# Patient Record
Sex: Female | Born: 1970 | Race: Black or African American | Hispanic: No | Marital: Married | Smoking: Never smoker
Health system: Southern US, Community
[De-identification: ages and names within clinical notes are randomized; demographics above are authoritative.]

## PROBLEM LIST (undated history)

## (undated) DIAGNOSIS — T7840XA Allergy, unspecified, initial encounter: Secondary | ICD-10-CM

## (undated) DIAGNOSIS — J189 Pneumonia, unspecified organism: Secondary | ICD-10-CM

## (undated) HISTORY — DX: Pneumonia, unspecified organism: J18.9

## (undated) HISTORY — DX: Allergy, unspecified, initial encounter: T78.40XA

---

## 2004-02-01 ENCOUNTER — Inpatient Hospital Stay (HOSPITAL_COMMUNITY): Admission: AD | Admit: 2004-02-01 | Discharge: 2004-02-02 | Payer: Self-pay | Admitting: Obstetrics and Gynecology

## 2004-04-09 ENCOUNTER — Ambulatory Visit: Payer: Self-pay | Admitting: Family Medicine

## 2004-04-12 ENCOUNTER — Encounter (INDEPENDENT_AMBULATORY_CARE_PROVIDER_SITE_OTHER): Payer: Self-pay | Admitting: *Deleted

## 2004-04-19 ENCOUNTER — Ambulatory Visit: Payer: Self-pay | Admitting: Sports Medicine

## 2004-05-25 ENCOUNTER — Ambulatory Visit: Payer: Self-pay | Admitting: Family Medicine

## 2004-06-01 ENCOUNTER — Encounter: Admission: RE | Admit: 2004-06-01 | Discharge: 2004-06-01 | Payer: Self-pay | Admitting: Sports Medicine

## 2004-07-03 ENCOUNTER — Ambulatory Visit: Payer: Self-pay | Admitting: Family Medicine

## 2004-07-19 ENCOUNTER — Ambulatory Visit: Payer: Self-pay | Admitting: Family Medicine

## 2004-07-30 ENCOUNTER — Encounter: Admission: RE | Admit: 2004-07-30 | Discharge: 2004-07-30 | Payer: Self-pay | Admitting: Sports Medicine

## 2004-08-20 ENCOUNTER — Ambulatory Visit: Payer: Self-pay | Admitting: Family Medicine

## 2004-08-28 ENCOUNTER — Ambulatory Visit: Payer: Self-pay | Admitting: Family Medicine

## 2004-08-29 ENCOUNTER — Ambulatory Visit (HOSPITAL_COMMUNITY): Admission: RE | Admit: 2004-08-29 | Discharge: 2004-08-29 | Payer: Self-pay | Admitting: Family Medicine

## 2004-09-06 ENCOUNTER — Ambulatory Visit: Payer: Self-pay | Admitting: Family Medicine

## 2004-09-14 ENCOUNTER — Ambulatory Visit: Payer: Self-pay | Admitting: Family Medicine

## 2004-09-19 ENCOUNTER — Ambulatory Visit: Payer: Self-pay | Admitting: Family Medicine

## 2004-09-27 ENCOUNTER — Ambulatory Visit: Payer: Self-pay | Admitting: Family Medicine

## 2004-09-28 ENCOUNTER — Inpatient Hospital Stay (HOSPITAL_COMMUNITY): Admission: AD | Admit: 2004-09-28 | Discharge: 2004-10-02 | Payer: Self-pay | Admitting: Obstetrics & Gynecology

## 2004-09-28 ENCOUNTER — Ambulatory Visit: Payer: Self-pay | Admitting: *Deleted

## 2004-09-28 ENCOUNTER — Ambulatory Visit: Payer: Self-pay | Admitting: Obstetrics & Gynecology

## 2004-09-29 ENCOUNTER — Encounter (INDEPENDENT_AMBULATORY_CARE_PROVIDER_SITE_OTHER): Payer: Self-pay | Admitting: *Deleted

## 2004-12-04 ENCOUNTER — Ambulatory Visit: Payer: Self-pay | Admitting: Family Medicine

## 2005-01-04 ENCOUNTER — Ambulatory Visit: Payer: Self-pay | Admitting: Sports Medicine

## 2005-01-31 ENCOUNTER — Ambulatory Visit: Payer: Self-pay | Admitting: Sports Medicine

## 2005-07-11 ENCOUNTER — Encounter: Admission: RE | Admit: 2005-07-11 | Discharge: 2005-07-11 | Payer: Self-pay | Admitting: Sports Medicine

## 2006-03-06 DIAGNOSIS — N6029 Fibroadenosis of unspecified breast: Secondary | ICD-10-CM

## 2006-03-07 ENCOUNTER — Encounter (INDEPENDENT_AMBULATORY_CARE_PROVIDER_SITE_OTHER): Payer: Self-pay | Admitting: *Deleted

## 2006-07-09 ENCOUNTER — Ambulatory Visit: Payer: Self-pay | Admitting: Sports Medicine

## 2006-07-29 ENCOUNTER — Encounter: Payer: Self-pay | Admitting: Family Medicine

## 2006-07-29 ENCOUNTER — Ambulatory Visit: Payer: Self-pay | Admitting: Family Medicine

## 2006-07-29 DIAGNOSIS — N926 Irregular menstruation, unspecified: Secondary | ICD-10-CM

## 2006-07-29 DIAGNOSIS — M549 Dorsalgia, unspecified: Secondary | ICD-10-CM | POA: Insufficient documentation

## 2006-07-29 DIAGNOSIS — R109 Unspecified abdominal pain: Secondary | ICD-10-CM

## 2006-07-29 LAB — CONVERTED CEMR LAB
Chlamydia, DNA Probe: NEGATIVE
GC Probe Amp, Genital: NEGATIVE
KOH Prep: NEGATIVE
Whiff Test: NEGATIVE

## 2006-08-04 ENCOUNTER — Ambulatory Visit (HOSPITAL_COMMUNITY): Admission: RE | Admit: 2006-08-04 | Discharge: 2006-08-04 | Payer: Self-pay | Admitting: Sports Medicine

## 2006-08-19 ENCOUNTER — Ambulatory Visit: Payer: Self-pay | Admitting: Family Medicine

## 2006-08-19 ENCOUNTER — Encounter: Payer: Self-pay | Admitting: Family Medicine

## 2007-09-04 ENCOUNTER — Emergency Department (HOSPITAL_COMMUNITY): Admission: EM | Admit: 2007-09-04 | Discharge: 2007-09-05 | Payer: Self-pay | Admitting: Emergency Medicine

## 2009-12-11 ENCOUNTER — Inpatient Hospital Stay (HOSPITAL_COMMUNITY)
Admission: AD | Admit: 2009-12-11 | Discharge: 2009-12-12 | Payer: Self-pay | Source: Home / Self Care | Admitting: Obstetrics

## 2010-01-07 DIAGNOSIS — J189 Pneumonia, unspecified organism: Secondary | ICD-10-CM

## 2010-01-07 HISTORY — DX: Pneumonia, unspecified organism: J18.9

## 2010-01-30 ENCOUNTER — Ambulatory Visit (HOSPITAL_COMMUNITY)
Admission: RE | Admit: 2010-01-30 | Discharge: 2010-01-30 | Payer: Self-pay | Source: Home / Self Care | Attending: Obstetrics | Admitting: Obstetrics

## 2010-03-19 LAB — COMPREHENSIVE METABOLIC PANEL
ALT: 15 U/L (ref 0–35)
Albumin: 2.9 g/dL — ABNORMAL LOW (ref 3.5–5.2)
Alkaline Phosphatase: 65 U/L (ref 39–117)
Calcium: 8.6 mg/dL (ref 8.4–10.5)
Potassium: 4 mEq/L (ref 3.5–5.1)
Sodium: 137 mEq/L (ref 135–145)
Total Protein: 5.7 g/dL — ABNORMAL LOW (ref 6.0–8.3)

## 2010-03-19 LAB — CBC
Platelets: 185 10*3/uL (ref 150–400)
RDW: 14 % (ref 11.5–15.5)
WBC: 9.8 10*3/uL (ref 4.0–10.5)

## 2010-03-25 ENCOUNTER — Inpatient Hospital Stay (HOSPITAL_COMMUNITY)
Admission: AD | Admit: 2010-03-25 | Discharge: 2010-03-28 | DRG: 775 | Disposition: A | Discharge status: Home / Self Care | Payer: Medicaid Other | Source: Ambulatory Visit | Attending: Obstetrics | Admitting: Obstetrics

## 2010-03-25 ENCOUNTER — Inpatient Hospital Stay (HOSPITAL_COMMUNITY): Payer: Medicaid Other

## 2010-03-25 DIAGNOSIS — O34219 Maternal care for unspecified type scar from previous cesarean delivery: Secondary | ICD-10-CM | POA: Diagnosis present

## 2010-03-25 LAB — COMPREHENSIVE METABOLIC PANEL
Albumin: 2.9 g/dL — ABNORMAL LOW (ref 3.5–5.2)
Alkaline Phosphatase: 211 U/L — ABNORMAL HIGH (ref 39–117)
BUN: 7 mg/dL (ref 6–23)
Chloride: 107 mEq/L (ref 96–112)
Creatinine, Ser: 0.69 mg/dL (ref 0.4–1.2)
GFR calc non Af Amer: >60 mL/min (ref >60)
Glucose, Bld: 96 mg/dL (ref 70–99)
Total Bilirubin: 0.3 mg/dL (ref 0.3–1.2)

## 2010-03-25 LAB — URIC ACID: Uric Acid, Serum: 6.3 mg/dL (ref 2.4–7.0)

## 2010-03-25 LAB — URINALYSIS, ROUTINE W REFLEX MICROSCOPIC
Bilirubin Urine: NEGATIVE
Ketones, ur: NEGATIVE mg/dL
Nitrite: NEGATIVE
Protein, ur: NEGATIVE mg/dL
Specific Gravity, Urine: <1.005 — ABNORMAL LOW (ref 1.005–1.030)
Urobilinogen, UA: 0.2 mg/dL (ref 0.0–1.0)

## 2010-03-25 LAB — RAPID HIV SCREEN (WH-MAU): Rapid HIV Screen: NONREACTIVE

## 2010-03-25 LAB — CBC
HCT: 36 % (ref 36.0–46.0)
MCH: 26.7 pg (ref 26.0–34.0)
MCV: 82.2 fL (ref 78.0–100.0)
Platelets: 204 10*3/uL (ref 150–400)
RBC: 4.38 MIL/uL (ref 3.87–5.11)
RDW: 14 % (ref 11.5–15.5)

## 2010-03-25 LAB — LACTATE DEHYDROGENASE: LDH: 151 U/L (ref 94–250)

## 2010-03-26 ENCOUNTER — Other Ambulatory Visit: Payer: Self-pay | Admitting: Obstetrics

## 2010-03-27 LAB — CBC
MCH: 26.2 pg (ref 26.0–34.0)
MCHC: 31.5 g/dL (ref 30.0–36.0)
Platelets: 194 10*3/uL (ref 150–400)
RBC: 4.05 MIL/uL (ref 3.87–5.11)
RDW: 14.3 % (ref 11.5–15.5)

## 2010-03-28 NOTE — H&P (Signed)
Megan [name], Megan [name]              ACCOUNT NO.:  [id number]  MEDICAL RECORD NO.:  [id number]           PATIENT TYPE:  I  LOCATION:  9165                          FACILITY:  [facility]  PHYSICIAN:  Roseanna Rainbow, M.D.DATE OF BIRTH:  [birth year]  DATE OF ADMISSION:  03/25/2010 DATE OF DISCHARGE:                             HISTORY & PHYSICAL   CHIEF COMPLAINT:  The patient is a 40 year old para 3 with an estimated date of confinement of March 25, 2010, complaining of contractions.  HISTORY OF PRESENT ILLNESS:  Please see the above.  She denies rupture of membranes.  She denies any headache, visual disturbances, shortness of breath, or epigastric pain.  She also denies any blood pressure elevations during this pregnancy or antecedent pregnancies.  The patient does report decreased fetal movement on the day of presentation.  SOCIAL HISTORY:  She is married, unemployed.  She denies any tobacco, ethanol, or drug use.  PAST GYN HISTORY:  Normal triad.  PAST OB HISTORY:  In 2006, she was delivered of a live born female, birth weight 6 pounds, full term cesarean delivery for failure to progress.  In 1997 she was delivered of a live born female, birth weight 7 pounds, full term, vaginal delivery, no complications.  In 1996, she  is delivered of a live born female 7 pounds, full term vaginal delivery, no complications.  OB RISK FACTORS:  Advanced maternal age, previous cesarean delivery  PAST MEDICAL HISTORY:  She denies past surgical history, please see the above.  PRENATAL COURSE:  Onset of care with Dr. Gaynell Face at 9 weeks.  PRENATAL LABS:  Hemoglobin 11.4, hematocrit 35.6, platelets 201,000, blood type A+, antibody screen negative.  Sickle cell negative.  RPR nonreactive, rubella immune.  Hepatitis B surface antigen negative.  Pap test negative.  GC and Chlamydia probes negative.  1-hour GTT 136, GBS negative on February 26, 2010.  Ultrasound, at 32 weeks 4 days on December 11, 2010, posterior placenta.  No previa.  FAMILY HISTORY:  She denies.  REVIEW OF SYSTEMS:  GU:  Please see the above.  NEUROLOGIC:  Please see the history of present illness.  GI:  Please see the history of present illness.  Pelvic exam per the RN the cervix is 2 cm dilated, 80% effaced.  PHYSICAL EXAMINATION:  VITAL SIGNS:  Blood pressures 130-140 over 70s- 90s.  Fetal heart tracing baseline 140s to 150s with periods of moderate long-term variability and absent variability, no decelerations noted. Tocodynamometer contractions every 2-4 minutes, general mild distress. ABDOMEN:  Gravid.  Sterile vaginal exam per the RN, cervix is 2 cm dilated, 80% effaced.  With no addendum to the above.  LABORATORY DATA:  Radiologic data:  A BPP 6/8, breathing was not observed.  ASSESSMENT:  Multipara at term, history of a previous cesarean delivery, desires a TOLAC, prodromal labor, biophysical profile 6/10.    Category 1  Fetal heart tracing, elevated blood pressures rule out gestational hypertension versus mild PIH, no neurologic symptoms at present.  PLAN:  Admission.  We will check a PIH panel.  Monitor closely, possible augmentation of labor cautiously with low-dose Pitocin per  protocol.     Roseanna Rainbow, M.D.     Judee Clara  D:  03/25/2010  T:  03/26/2010  Job:  [id number]  cc:   Kathreen Cosier, M.D. Fax: [phone]  Electronically Signed by Antionette Char M.D. on 03/28/2010 10:05:22 PM

## 2010-05-25 NOTE — Op Note (Signed)
Megan [name], Megan [name]              ACCOUNT NO.:  [id number]   MEDICAL RECORD NO.:  [id number]          PATIENT TYPE:  INP   LOCATION:                                [facility]  [facility]   PHYSICIAN:  Lesly Dukes, M.D. DATE OF BIRTH:  [birth year]   DATE OF PROCEDURE:  09/29/2004  DATE OF DISCHARGE:                                 OPERATIVE REPORT   PREOPERATIVE DIAGNOSIS:  A 40 year old, para 2, female at 50 weeks estimated  gestational age with nonreassuring fetal heart tracing and thick meconium.   POSTOPERATIVE DIAGNOSIS:  A 40 year old, para 2, female at 68 weeks  estimated gestational age with nonreassuring fetal heart tracing and thick  meconium.   OPERATION/PROCEDURE:  Primary low flap transverse cesarean section.   SURGEON:  Lesly Dukes, M.D.   ASSISTANT:  Benn Moulder, M.D.   ANESTHESIA:  Spinal.   PATHOLOGY:  Placenta.   ESTIMATED BLOOD LOSS:  800 mL.   COMPLICATIONS:  None.   FINDINGS:  A viable female infant, Apgars 8 at one minute and 9 at five  minutes, delivered from vertex presentation, thick meconium LOA.  Cord next  to the head at the time of uterine incision.  Grossly normal uterus, ovaries  and fallopian tubes.   DESCRIPTION OF PROCEDURE:  After informed consent was obtained, the patient  was taken to the operating room where spinal anesthesia was found to be  adequate.  The patient was placed in the dorsal supine position with a  leftward tilt and prepped and draped in the normal sterile fashion.  The  Foley was already in the bladder.   A Pfannenstiel skin incision was made with the scalpel and carried down to  the underlying layer of fascia.  The fascia was incised in the midline and  this incision was extended bilaterally with the Mayo scissors.  The superior  and inferior aspects of the fascial incision were grasped with Kocher  clamps, tented up, dissected off sharply and bluntly from underlying layers  of rectus muscles.  The rectus muscles  were separated in the midline.  The  peritoneum was identified, entered bluntly and this incision was extended  with good visualization of the bladder.  The bladder blade was inserted.  The uterine incision was made in a transverse fashion in the lower uterine  segment with the scalpel.  This incision was extended bilaterally bluntly.  The baby's head was delivered and the nose and mouth were suctioned with  DeLee suction.  The rest of the baby's body delivered easily.  The cord was  clamped and cut and the baby was handed off to the waiting pediatrician.  Cord blood was sent for type and screen and a cord arterial cord gas was  also sent.  Placenta delivered manually and the uterus was cleared of all  clots and debris.   The uterine incision was closed with 0 Vicryl in a running locked fashion.  A second suture of 0 Vicryl was used to aid in hemostasis.  The uterus was  noted to be hemostatic off tension.  Abdomen was copiously irrigated  with  warm normal saline.  Again the uterus was noted to be hemostatic.  The  peritoneum were noted to be hemostatic.  The rectus muscle was hemostatic.  The fascia was closed with 0 Vicryl in a running fashion and noted to be  hemostatic.  The subcutaneous tissue was closed copiously irrigated and  found to be hemostatic.  The skin was closed with staples and a dressing was  placed on the patient's abdomen.  The patient tolerated the procedure well.  Sponge, lab, instrument, and needle counts were x2.  The patient went to the  recovery room in stable condition.           ______________________________  Lesly Dukes, M.D.     KHL/MEDQ  D:  09/29/2004  T:  09/30/2004  Job:  [id number]

## 2010-05-25 NOTE — Discharge Summary (Signed)
Megan [name], Megan [name]              ACCOUNT NO.:  [id number]   MEDICAL RECORD NO.:  [id number]          PATIENT TYPE:  INP   LOCATION:  9129                          [facility]ILITY:  [facility]   PHYSICIAN:  Lesly Dukes, M.D. DATE OF BIRTH:  May 22, 1970   DATE OF ADMISSION:  09/28/2004  DATE OF DISCHARGE:  10/02/2004                                 DISCHARGE SUMMARY   ADMISSION DIAGNOSES:  1.  Intrauterine pregnancy at 40 weeks, 6 days.  2.  Spontaneous onset of labor.   DISCHARGE DIAGNOSES:  1.  Status post low transverse Cesarean section.  2.  Viable 6 pounds, 4 ounces, female.   DISCHARGE MEDICATIONS:  1.  Percocet.  2.  Prenatal vitamins.   HISTORY OF PRESENT ILLNESS:  Briefly, the patient is a 40 year old gravida  3, para 2-0-0-2 at 40 weeks, 6 days who presented with contractions every  five minutes.  In the maternity admissions she had fetal heart tones showing  late onset variables.  The patient was GBS positive.   HOSPITAL COURSE:  The patient was admitted to labor and delivery and was  started on Ampicillin for GBS prophylaxis.  The patient's membranes were  artificially ruptured.  There was thick meconium after the rupture of  membranes. An IUPC was placed as well as a fetal scalp electrode. An amnio  infusion was started.  The patient's fetal heart tones were reassuring  briefly, but, the patient eventually continued to have decel's and was taken  to the operating room for Cesarean section.  A 6 pounds, 4 ounces female was  born with no complications.  Apgar's were 8 at 1 minute and 9 at 5 minutes.  The patient's postoperative course was uncomplicated.  The patient's blood  type is A positive.  She was Rubella immune.  Her postoperative hemoglobin  was 10.4.  The patient is breastfeeding and will have a Depo shot prior to  discharge.  Staples were removed prior to discharge as well.   CONDITION ON DISCHARGE:  Stable.   DISCHARGE INSTRUCTIONS:  The patient is to avoid  sexual activity for six  weeks.  She is to abstain from heavy lifting.  She is to follow up with Dr.  Raquel James at [facility] in six weeks for her routine  postpartum check. The patient is to use Percocet as directed for pain  control and continue her prenatal vitamins for as long as she is  breastfeeding.      Benn Moulder, M.D.    ______________________________  Lesly Dukes, M.D.    MR/MEDQ  D:  10/02/2004  T:  10/02/2004  Job:  [id number]

## 2010-06-17 ENCOUNTER — Emergency Department (HOSPITAL_COMMUNITY): Payer: Self-pay

## 2010-06-17 ENCOUNTER — Inpatient Hospital Stay (HOSPITAL_COMMUNITY)
Admission: EM | Admit: 2010-06-17 | Discharge: 2010-06-19 | DRG: 195 | Disposition: A | Discharge status: Home / Self Care | Payer: Self-pay | Attending: Internal Medicine | Admitting: Internal Medicine

## 2010-06-17 DIAGNOSIS — J189 Pneumonia, unspecified organism: Principal | ICD-10-CM | POA: Diagnosis present

## 2010-06-17 DIAGNOSIS — E876 Hypokalemia: Secondary | ICD-10-CM | POA: Diagnosis present

## 2010-06-17 LAB — POCT I-STAT, CHEM 8
HCT: 39 % (ref 36.0–46.0)
Hemoglobin: 13.3 g/dL (ref 12.0–15.0)
Sodium: 137 mEq/L (ref 135–145)
TCO2: 22 mmol/L (ref 0–100)

## 2010-06-17 LAB — URINALYSIS, ROUTINE W REFLEX MICROSCOPIC
Glucose, UA: NEGATIVE mg/dL
Leukocytes, UA: NEGATIVE
Nitrite: NEGATIVE
Protein, ur: NEGATIVE mg/dL
pH: 6 (ref 5.0–8.0)

## 2010-06-17 LAB — URINE MICROSCOPIC-ADD ON

## 2010-06-17 LAB — CBC
Hemoglobin: 12.1 g/dL (ref 12.0–15.0)
Platelets: ADEQUATE 10*3/uL (ref 150–400)
RBC: 4.49 MIL/uL (ref 3.87–5.11)

## 2010-06-17 LAB — POCT PREGNANCY, URINE: Preg Test, Ur: NEGATIVE

## 2010-06-17 LAB — DIFFERENTIAL
Basophils Absolute: 0 10*3/uL (ref 0.0–0.1)
Basophils Relative: 0 % (ref 0–1)
Neutro Abs: 3.8 10*3/uL (ref 1.7–7.7)
Neutrophils Relative %: 69 % (ref 43–77)

## 2010-06-17 LAB — RAPID HIV SCREEN (WH-MAU): Rapid HIV Screen: NONREACTIVE

## 2010-06-18 LAB — COMPREHENSIVE METABOLIC PANEL
AST: 21 U/L (ref 0–37)
Albumin: 2.8 g/dL — ABNORMAL LOW (ref 3.5–5.2)
BUN: 8 mg/dL (ref 6–23)
Calcium: 7.9 mg/dL — ABNORMAL LOW (ref 8.4–10.5)
Chloride: 107 mEq/L (ref 96–112)
Creatinine, Ser: 0.56 mg/dL (ref 0.4–1.2)
Total Protein: 5.8 g/dL — ABNORMAL LOW (ref 6.0–8.3)

## 2010-06-18 LAB — DIFFERENTIAL
Eosinophils Absolute: 0 10*3/uL (ref 0.0–0.7)
Eosinophils Relative: 1 % (ref 0–5)
Lymphs Abs: 1.5 10*3/uL (ref 0.7–4.0)
Monocytes Absolute: 0.2 10*3/uL (ref 0.1–1.0)

## 2010-06-18 LAB — PHOSPHORUS: Phosphorus: 3.4 mg/dL (ref 2.3–4.6)

## 2010-06-18 LAB — MAGNESIUM: Magnesium: 2.3 mg/dL (ref 1.5–2.5)

## 2010-06-18 LAB — CBC
MCH: 26 pg (ref 26.0–34.0)
MCHC: 31.8 g/dL (ref 30.0–36.0)
MCV: 81.7 fL (ref 78.0–100.0)
Platelets: 153 10*3/uL (ref 150–400)
RDW: 14.7 % (ref 11.5–15.5)
WBC: 3.4 10*3/uL — ABNORMAL LOW (ref 4.0–10.5)

## 2010-06-23 LAB — CULTURE, BLOOD (ROUTINE X 2)
Culture  Setup Time: 201206101125
Culture: NO GROWTH

## 2010-06-28 NOTE — Discharge Summary (Signed)
  NAMEMELENA, [name] NO.:  [id number]  MEDICAL RECORD NO.:  [id number]  LOCATION:                                 FACILITY:  PHYSICIAN:  Marinda Elk, M.D.DATE OF BIRTH:  [birth year]  DATE OF ADMISSION: DATE OF DISCHARGE:                              DISCHARGE SUMMARY   OBSTETRIC/GYNECOLOGICAL DOCTOR:  Megan Cosier, MD  This is a 40 year old female with a successful delivery at [facility] about a week ago, started developing cough, getting poor, and some low-grade fever.  When she coughs so much, it eventually hurts her. She relates some lower extremity swelling.  She is currently breastfeeding.  She denies any nausea, vomiting, abdominal pain, constipation, or significant shortness of breath.  So we are asked to admit her for further evaluate.  Please refer to dictation from June 17, 2010, for further details.  LABORATORY DATA ON ADMISSION:  White count 5.5, hemoglobin of 13, sodium 137, potassium 3.5, creatinine 1.0.  Chest x-ray showed left middle lobe pneumonia with airspace disease.  ASSESSMENT/PLAN: 1. Community-acquired pneumonia.  She was started on Rocephin and     Azithro on admission.  Her cough improved.  She was changed to Keflex     and Azithro.  This was discussed with the Pharmacy if this was safe     while the patient was breast-feeding and it was okay and looking up     reactions they relate it was okay.  She was changed to Keflex p.o.     and Cipro p.o. which she would take for 5 more days. 2. Hypokalemia.  This is probably secondary to decreased p.o. intake.     This was repleted.  No changes were made.  VITAL SIGNS ON DAY OF DISCHARGE:  Temperature 98, pulse 79, respirations 18, and blood pressure 112/68.  She was saturating 95% on room air.  LABORATORY DATA ON DAY OF DISCHARGE:  None.     Marinda Elk, M.D.     AF/MEDQ  D:  06/19/2010  T:  06/20/2010  Job:  [id number]  Electronically Signed by  Marinda Elk M.D. on 06/28/2010 06:19:47 PM

## 2010-07-01 NOTE — H&P (Signed)
  Megan [name], Megan [name]              ACCOUNT NO.:  [id number]  MEDICAL RECORD NO.:  [id number]  LOCATION:  WLED                         FACILITY:  [facility]  PHYSICIAN:  Michiel Cowboy, MDDATE OF BIRTH:  October 11, 1970  DATE OF ADMISSION:  06/17/2010 DATE OF DISCHARGE:                             HISTORY & PHYSICAL   PRIMARY CARE PHYSICIAN:  The patient has been seen by OB/GYN, Dr. Francoise Ceo.  HISTORY OF PRESENT ILLNESS:  The patient is a 40 year old female who had a successful delivery 3 months ago at [facility].  About a week ago, she started to develop cough, feeling poorly, and some low grade fevers.  When she coughs too much, eventually her chest hurts, but there are no painful respirations.  She has had lower extremity swelling.  She is currently still breast-feeding her child.  She denies any nausea, vomiting, or constipation.  She has not had significant shortness of breath associated with this.  PAST MEDICAL HISTORY:  Unremarkable.  SOCIAL HISTORY:  The patient does not smoke, drink, or abuse drugs.  FAMILY HISTORY:  Noncontributory.  ALLERGIES:  None.  MEDICATIONS:  She was recently prescribed for this illness by her OB/GYN Tessalon Perles 100 mg at bedtime, Bactrim DS in 800/160 twice a day, and Tylenol with Codeine No. 3 as needed, which she had been taking it every 4 hours as needed for pain.  PHYSICAL EXAMINATION:  VITAL SIGNS:  Temperature 101.7, blood pressure 130/79, pulse 99, respirations 20, saturating 98% currently. Otherwise, the patient appears to be in no acute distress.  Initially, the patient was satting 88% on room air, but now she is currently satting 100% on 2 L. HEENT:  Head:  Nontraumatic.  Moist mucous membranes. LUNGS:  There may be some diminished air movement on the left, but no wheezes appreciated. HEART:  Regular, rate, and rhythm.  No murmurs appreciated. ABDOMEN:  Soft, nontender, nondistended. LOWER EXTREMITIES:  Without  clubbing, cyanosis, or edema. NEUROLOGIC:  Grossly intact. SKIN:  No rashes noted.  LABORATORY DATA:  White blood cell count 5.5, hemoglobin is 13.3. Sodium 137, potassium 3.5, creatinine 1.0.  Chest x-ray show left middle lung with airspace opacity consistent pneumonia.  ASSESSMENT AND PLAN:  This is a 40 year old female with: 1. What appears to be left lung pneumonia in the setting of recently     giving birth.  We will admit with Rocephin and azithromycin, be     mindful of the patient being breast-feeding.  We will see if she     truly still has oxygen requirement.  Prior to discharge, she may     need to be ambulated and see if she is able to tolerate this     without     the need of oxygen.  Emergency department already ordered blood     cultures. 2. Prophylaxis, good p.o. intake and SCDs. 3. Mild hypokalemia.  We will replace.     Michiel Cowboy, MD     AVD/MEDQ  D:  06/17/2010  T:  06/17/2010  Job:  [id number]  cc:   Kathreen Cosier, M.D. Fax: [phone]  Electronically Signed by Therisa Doyne MD on 07/01/2010 09:38:38 PM

## 2011-03-23 ENCOUNTER — Other Ambulatory Visit: Payer: Self-pay

## 2011-03-23 ENCOUNTER — Emergency Department (HOSPITAL_BASED_OUTPATIENT_CLINIC_OR_DEPARTMENT_OTHER)
Admission: EM | Admit: 2011-03-23 | Discharge: 2011-03-24 | Disposition: A | Discharge status: Home / Self Care | Payer: Self-pay | Attending: Emergency Medicine | Admitting: Emergency Medicine

## 2011-03-23 ENCOUNTER — Emergency Department (HOSPITAL_BASED_OUTPATIENT_CLINIC_OR_DEPARTMENT_OTHER): Payer: Self-pay

## 2011-03-23 ENCOUNTER — Encounter (HOSPITAL_BASED_OUTPATIENT_CLINIC_OR_DEPARTMENT_OTHER): Payer: Self-pay | Admitting: *Deleted

## 2011-03-23 DIAGNOSIS — O99891 Other specified diseases and conditions complicating pregnancy: Secondary | ICD-10-CM | POA: Insufficient documentation

## 2011-03-23 DIAGNOSIS — M545 Low back pain, unspecified: Secondary | ICD-10-CM | POA: Insufficient documentation

## 2011-03-23 DIAGNOSIS — Z331 Pregnant state, incidental: Secondary | ICD-10-CM

## 2011-03-23 DIAGNOSIS — R55 Syncope and collapse: Secondary | ICD-10-CM | POA: Insufficient documentation

## 2011-03-23 DIAGNOSIS — R109 Unspecified abdominal pain: Secondary | ICD-10-CM | POA: Insufficient documentation

## 2011-03-23 DIAGNOSIS — R42 Dizziness and giddiness: Secondary | ICD-10-CM | POA: Insufficient documentation

## 2011-03-23 LAB — GLUCOSE, CAPILLARY: Glucose-Capillary: 98 mg/dL (ref 70–99)

## 2011-03-23 MED ORDER — SODIUM CHLORIDE 0.9 % IV BOLUS (SEPSIS)
1000.0000 mL | Freq: Once | INTRAVENOUS | Status: AC
  Administered 2011-03-24: 1000 mL via INTRAVENOUS

## 2011-03-23 NOTE — ED Notes (Signed)
cbg 98 

## 2011-03-23 NOTE — ED Provider Notes (Addendum)
History    This chart was scribed for Cyndra Numbers, MD, MD by Smitty Pluck. The patient was seen in room MHT13 and the patient's care was started at 11:28PM.   CSN: [id number]  Arrival date & time 03/23/11  2300   First MD Initiated Contact with Patient 03/23/11 2325      Chief Complaint  Patient presents with  . Fall    (Consider location/radiation/quality/duration/timing/severity/associated sxs/prior treatment) HPI Megan [name] is a 41 y.o. G5 P4 014 female who presents to the Emergency Department complaining of fall onset today outside of [employer]. Pt reports that she felt dizzy before falling. She denied any chest pain or shortness of breath prior this. She did not have any loss of consciousness but thought she might almost blacked out. She has moderate lower back pain noted to be bilateral. Patient also complains of some mild lower abdominal pain.. First day of last menstrual cycle was 02-01-11. Patient is unsure if she is pregnant. Pt denies having fevers or being sick. Denies LOC and hx of syncope. There is no reported seizure activity. Patient had no incontinence. She has no numbness, tingling, or paresthesias.  Patient had been and drunk plenty of fluids today. There are no other associated or modifying factors.  History reviewed. No pertinent past medical history.  History reviewed. No pertinent past surgical history.  No family history on file.  History  Substance Use Topics  . Smoking status: Not on file  . Smokeless tobacco: Not on file  . Alcohol Use: Not on file    OB History    Grav Para Term Preterm Abortions TAB SAB Ect Mult Living                  Review of Systems  Constitutional: Negative.   HENT: Negative.   Eyes: Negative.   Respiratory: Negative.   Cardiovascular: Negative.   Gastrointestinal: Positive for abdominal pain.  Genitourinary: Negative.   Musculoskeletal: Positive for back pain.  Neurological:       See history of present illness    Hematological: Negative.   Psychiatric/Behavioral: Negative.   All other systems reviewed and are negative.   10 Systems reviewed and are negative for acute change except as noted in the HPI.  Allergies  Review of patient's allergies indicates no known allergies.  Home Medications   Current Outpatient Rx  Name Route Sig Dispense Refill  . IRON PO Oral Take 1 tablet by mouth daily.    Marland Kitchen PRENATAL MULTIVITAMIN CH Oral Take 1 tablet by mouth daily.      BP 122/85  Pulse 79  Temp 97.8 F (36.6 C)  Resp 16  SpO2 100%  Physical Exam  Nursing note and vitals reviewed. Constitutional: She is oriented to person, place, and time. She appears well-developed and well-nourished. No distress.  HENT:  Head: Normocephalic and atraumatic.  Eyes: Conjunctivae are normal. Pupils are equal, round, and reactive to light.  Neck: Normal range of motion. Neck supple.       Patient arrives in c-collar. She had no tenderness to palpation in the midline. Patient was clinically cleared and had full pain-free range of motion.  Cardiovascular: Normal rate, regular rhythm and normal heart sounds.   Pulmonary/Chest: Effort normal and breath sounds normal. No respiratory distress.  Abdominal: Soft. Bowel sounds are normal. She exhibits no distension. There is tenderness. There is no rebound and no guarding.       Mild lower abdominal tenderness noted in the bilateral lower quadrants.  Genitourinary: Pelvic exam was performed with patient supine. Cervix exhibits discharge. Cervix exhibits no motion tenderness and no friability. Right adnexum displays no tenderness. Left adnexum displays no tenderness.       Patient had small amount of mucoid discharge view to at the cervical os which was visually closed. There is no vaginal bleeding or other discharge noted.  Musculoskeletal: She exhibits tenderness (lateral to spine with palpation.).       Patient arrived restrained on backboard. She had no midline  tenderness to palpation. Patient had tenderness to palpation in the buttocks and bilateral lower lumbar area and the paraspinal musculature. She was able to move all 4 extremities without difficulty. No other abnormalities or tenderness is noted.  Neurological: She is alert and oriented to person, place, and time. She has normal reflexes. No cranial nerve deficit. She exhibits normal muscle tone. Coordination normal.  Skin: Skin is warm and dry.  Psychiatric: She has a normal mood and affect. Her behavior is normal.    ED Course  Procedures (including critical care time)   Date: 03/24/2011  Rate: 68  Rhythm: normal sinus rhythm  QRS Axis: normal  Intervals: normal  ST/T Wave abnormalities: normal  Conduction Disutrbances: none  Narrative Interpretation: unremarkable     DIAGNOSTIC STUDIES: Oxygen Saturation is 100% on room air, normal by my interpretation.    COORDINATION OF CARE: 12:20PM EDP Rechecks pt. And discusses pt lab results. Pt is pregnant. Pt has been pregnant 5 times. 1 miscarriage. She has had 3 vaginal deliveries. 1 c-section.    Labs Reviewed  CBC - Abnormal; Notable for the following:    Hemoglobin 11.9 (*)    All other components within normal limits  COMPREHENSIVE METABOLIC PANEL - Abnormal; Notable for the following:    Total Bilirubin 0.2 (*)    GFR calc non Af Amer 80 (*)    All other components within normal limits  PREGNANCY, URINE - Abnormal; Notable for the following:    Preg Test, Ur POSITIVE (*)    All other components within normal limits  HCG, QUANTITATIVE, PREGNANCY - Abnormal; Notable for the following:    hCG, Beta Chain, Quant, S 3199 (*)    All other components within normal limits  WET PREP, GENITAL - Abnormal; Notable for the following:    Clue Cells Wet Prep HPF POC FEW (*)    WBC, Wet Prep HPF POC TOO NUMEROUS TO COUNT (*) MANY BACTERIA SEEN   All other components within normal limits  DIFFERENTIAL  URINALYSIS, ROUTINE W REFLEX  MICROSCOPIC  GLUCOSE, CAPILLARY  GC/CHLAMYDIA PROBE AMP, GENITAL   No results found.   1. Pregnancy test positive for incidental pregnancy   2. Near syncope       MDM  Patient was evaluated by myself. Based on presentation with near syncope workup was performed including EKG, urinalysis, urine pregnancy, CBC, renal panel. Patient denied any chest pain or shortness of breath. EKG was unremarkable. Patient did have positive urine pregnancy test. She was not anemic. A quantitative beta hCG was performed that was 3199. Type and screen was not performed here as this lab value is not transferable from our laboratory. Ultrasound was unavailable this evening. Pelvic exam demonstrated closed os no vaginal bleeding. There is a small amount of mucoid discharge. Patient did have a few clue cells noted on wet prep. Gonorrhea and chlamydia swab was sent. Patient did not have complete syncope and additional imaging was not warranted. She had some lower abdominal pain  and lower back pain noted immediately following being taken off the backboard. This improved while in the emergency department. Given patient's incidental discovery of pregnancy as well as near-syncope patient did require ultrasound this evening. I spoke with Dr. Vickey Sages who was on call for gynecology. Patient was accepted to be evaluated at the emergency department at [facility] with ultrasound. Patient is hemodynamically stable and has remained that way for her time in the emergency department. She will go by private vehicle to [facility] for ultrasound and further evaluation. Patient understands that she is to go directly to [facility]  I personally performed the services described in this documentation, which was scribed in my presence. The recorded information has been reviewed and considered.       Cyndra Numbers, MD 03/24/11 1610  Cyndra Numbers, MD 03/24/11 [id number]

## 2011-03-23 NOTE — ED Notes (Signed)
Larey Seat outside of [place]. Denies LOC, pain 8/10 in back. Denies neck pain.

## 2011-03-24 ENCOUNTER — Inpatient Hospital Stay (HOSPITAL_COMMUNITY): Payer: Self-pay

## 2011-03-24 ENCOUNTER — Encounter (HOSPITAL_COMMUNITY): Payer: Self-pay

## 2011-03-24 ENCOUNTER — Inpatient Hospital Stay (HOSPITAL_COMMUNITY)
Admission: AD | Admit: 2011-03-24 | Discharge: 2011-03-24 | Disposition: A | Discharge status: Home / Self Care | Payer: Self-pay | Source: Ambulatory Visit | Attending: Obstetrics and Gynecology | Admitting: Obstetrics and Gynecology

## 2011-03-24 DIAGNOSIS — R109 Unspecified abdominal pain: Secondary | ICD-10-CM | POA: Insufficient documentation

## 2011-03-24 DIAGNOSIS — Z1389 Encounter for screening for other disorder: Secondary | ICD-10-CM

## 2011-03-24 DIAGNOSIS — Z3490 Encounter for supervision of normal pregnancy, unspecified, unspecified trimester: Secondary | ICD-10-CM

## 2011-03-24 DIAGNOSIS — O99891 Other specified diseases and conditions complicating pregnancy: Secondary | ICD-10-CM | POA: Insufficient documentation

## 2011-03-24 LAB — HCG, QUANTITATIVE, PREGNANCY: hCG, Beta Chain, Quant, S: 3199 m[IU]/mL — ABNORMAL HIGH (ref <5)

## 2011-03-24 LAB — COMPREHENSIVE METABOLIC PANEL
ALT: 11 U/L (ref 0–35)
AST: 15 U/L (ref 0–37)
Alkaline Phosphatase: 65 U/L (ref 39–117)
CO2: 25 mEq/L (ref 19–32)
GFR calc Af Amer: >90 mL/min (ref >90)
GFR calc non Af Amer: 80 mL/min — ABNORMAL LOW (ref >90)
Glucose, Bld: 98 mg/dL (ref 70–99)
Potassium: 3.7 mEq/L (ref 3.5–5.1)
Sodium: 137 mEq/L (ref 135–145)

## 2011-03-24 LAB — CBC
MCV: 84.1 fL (ref 78.0–100.0)
Platelets: 218 10*3/uL (ref 150–400)
RBC: 4.28 MIL/uL (ref 3.87–5.11)
RDW: 13.1 % (ref 11.5–15.5)
WBC: 9.2 10*3/uL (ref 4.0–10.5)

## 2011-03-24 LAB — URINALYSIS, ROUTINE W REFLEX MICROSCOPIC
Bilirubin Urine: NEGATIVE
Hgb urine dipstick: NEGATIVE
Ketones, ur: NEGATIVE mg/dL
Nitrite: NEGATIVE
Specific Gravity, Urine: 1.012 (ref 1.005–1.030)
Urobilinogen, UA: 0.2 mg/dL (ref 0.0–1.0)
pH: 6.5 (ref 5.0–8.0)

## 2011-03-24 LAB — WET PREP, GENITAL
Trich, Wet Prep: NONE SEEN
Yeast Wet Prep HPF POC: NONE SEEN

## 2011-03-24 LAB — DIFFERENTIAL
Basophils Absolute: 0.1 10*3/uL (ref 0.0–0.1)
Lymphocytes Relative: 30 % (ref 12–46)
Lymphs Abs: 2.7 10*3/uL (ref 0.7–4.0)
Neutro Abs: 5.7 10*3/uL (ref 1.7–7.7)
Neutrophils Relative %: 62 % (ref 43–77)

## 2011-03-24 NOTE — MAU Provider Note (Signed)
Megan [name] [id number] @[redacted]w[redacted]d  Chief Complaint  Patient presents with  . Abdominal Pain    SUBJECTIVE  HPI: Pt presents to MAU from [facility] for u/s to rule out ectopic.  She presented to [facility] with left flank and left lower back pain following a syncopal episode and fall.  Labs drawn and pelvic exam with cultures done in [facility].  Pt instructed to come to [facility] immediately following visit in [facility] for u/s.  She denies pain currently, and denies LOF, cramping, vaginal bleeding, vaginal burning/itching, n/v, dizziness, urinary symptoms, or fever/chills.  History reviewed. No pertinent past medical history. Past Surgical History  Procedure Date  . Cesarean section    History   Social History  . Marital Status: Married    Spouse Name: N/A    Number of Children: N/A  . Years of Education: N/A   Occupational History  . Not on file.   Social History Main Topics  . Smoking status: Never Smoker   . Smokeless tobacco: Not on file  . Alcohol Use: No  . Drug Use: No  . Sexually Active: Yes    Birth Control/ Protection: None   Other Topics Concern  . Not on file   Social History Narrative  . No narrative on file   Current Facility-Administered Medications on File Prior to Encounter  Medication Dose Route Frequency Provider Last Rate Last Dose  . sodium chloride 0.9 % bolus 1,000 mL  1,000 mL Intravenous Once Cyndra Numbers, MD   1,000 mL at 03/24/11 0031   Current Outpatient Prescriptions on File Prior to Encounter  Medication Sig Dispense Refill  . IRON PO Take 1 tablet by mouth daily. Patient states the Iron dosage is 27mg        . Prenatal Vit-Fe Fumarate-FA (PRENATAL MULTIVITAMIN) TABS Take 1 tablet by mouth daily.       No Known Allergies  ROS: Pertinent items in HPI  OBJECTIVE Height 5' (1.524 m), weight 85.843 kg (189 lb 4 oz), last menstrual period 02/01/2011. GENERAL: Well-developed, well-nourished female in no acute distress.  LUNGS:  Clear to auscultation bilaterally.  HEART: Regular rate and rhythm. ABDOMEN: Soft, nontender EXTREMITIES: Nontender, no edema Pelvic exam deferred.  Done in [facility] 03/23/11.      LAB RESULTS Results for orders placed during the hospital encounter of 03/23/11 (from the past 24 hour(s))  GLUCOSE, CAPILLARY     Status: Normal   Collection Time   03/23/11 11:46 PM      Component Value Range   Glucose-Capillary 98  70 - 99 (mg/dL)   Comment 1 Notify RN     Comment 2 Documented in Chart    URINALYSIS, ROUTINE W REFLEX MICROSCOPIC     Status: Normal   Collection Time   03/23/11 11:58 PM      Component Value Range   Color, Urine YELLOW  YELLOW    APPearance CLEAR  CLEAR    Specific Gravity, Urine 1.012  1.005 - 1.030    pH 6.5  5.0 - 8.0    Glucose, UA NEGATIVE  NEGATIVE (mg/dL)   Hgb urine dipstick NEGATIVE  NEGATIVE    Bilirubin Urine NEGATIVE  NEGATIVE    Ketones, ur NEGATIVE  NEGATIVE (mg/dL)   Protein, ur NEGATIVE  NEGATIVE (mg/dL)   Urobilinogen, UA 0.2  0.0 - 1.0 (mg/dL)   Nitrite NEGATIVE  NEGATIVE    Leukocytes, UA NEGATIVE  NEGATIVE   PREGNANCY, URINE     Status: Abnormal  Collection Time   03/23/11 11:58 PM      Component Value Range   Preg Test, Ur POSITIVE (*) NEGATIVE   HCG, QUANTITATIVE, PREGNANCY     Status: Abnormal   Collection Time   03/24/11 12:17 AM      Component Value Range   hCG, Beta Chain, Quant, S 3199 (*) <5 (mIU/mL)  CBC     Status: Abnormal   Collection Time   03/24/11 12:20 AM      Component Value Range   WBC 9.2  4.0 - 10.5 (K/uL)   RBC 4.28  3.87 - 5.11 (MIL/uL)   Hemoglobin 11.9 (*) 12.0 - 15.0 (g/dL)   HCT 16.1  09.6 - 04.5 (%)   MCV 84.1  78.0 - 100.0 (fL)   MCH 27.8  26.0 - 34.0 (pg)   MCHC 33.1  30.0 - 36.0 (g/dL)   RDW 40.9  81.1 - 91.4 (%)   Platelets 218  150 - 400 (K/uL)  DIFFERENTIAL     Status: Normal   Collection Time   03/24/11 12:20 AM      Component Value Range   Neutrophils Relative 62  43 - 77 (%)   Neutro Abs 5.7   1.7 - 7.7 (K/uL)   Lymphocytes Relative 30  12 - 46 (%)   Lymphs Abs 2.7  0.7 - 4.0 (K/uL)   Monocytes Relative 6  3 - 12 (%)   Monocytes Absolute 0.6  0.1 - 1.0 (K/uL)   Eosinophils Relative 2  0 - 5 (%)   Eosinophils Absolute 0.2  0.0 - 0.7 (K/uL)   Basophils Relative 1  0 - 1 (%)   Basophils Absolute 0.1  0.0 - 0.1 (K/uL)  COMPREHENSIVE METABOLIC PANEL     Status: Abnormal   Collection Time   03/24/11 12:20 AM      Component Value Range   Sodium 137  135 - 145 (mEq/L)   Potassium 3.7  3.5 - 5.1 (mEq/L)   Chloride 102  96 - 112 (mEq/L)   CO2 25  19 - 32 (mEq/L)   Glucose, Bld 98  70 - 99 (mg/dL)   BUN 20  6 - 23 (mg/dL)   Creatinine, Ser 7.82  0.50 - 1.10 (mg/dL)   Calcium 9.6  8.4 - 95.6 (mg/dL)   Total Protein 7.3  6.0 - 8.3 (g/dL)   Albumin 3.8  3.5 - 5.2 (g/dL)   AST 15  0 - 37 (U/L)   ALT 11  0 - 35 (U/L)   Alkaline Phosphatase 65  39 - 117 (U/L)   Total Bilirubin 0.2 (*) 0.3 - 1.2 (mg/dL)   GFR calc non Af Amer 80 (*) >90 (mL/min)   GFR calc Af Amer >90  >90 (mL/min)  WET PREP, GENITAL     Status: Abnormal   Collection Time   03/24/11  3:14 AM      Component Value Range   Yeast Wet Prep HPF POC NONE SEEN  NONE SEEN    Trich, Wet Prep NONE SEEN  NONE SEEN    Clue Cells Wet Prep HPF POC FEW (*) NONE SEEN    WBC, Wet Prep HPF POC TOO NUMEROUS TO COUNT (*) NONE SEEN     IMAGING US Ob Comp Less 14 Wks  03/24/2011  *RADIOLOGY REPORT*  Clinical Data: Back pain  OBSTETRIC <14 WK ULTRASOUND  Technique:  Transabdominal ultrasound was performed for evaluation of the gestation as well as the maternal uterus and adnexal regions.  Comparison:  None.  Intrauterine gestational sac: Single Yolk sac: Not identified Embryo: Present Cardiac Activity: Not identified Heart Rate: Not identified bpm  CRL:  3.4 mm  6w   zerod            [place] EDC: No  Maternal uterus/Adnexae: There is a corpus luteal cyst in the right ovary.  Left ovary is normal.  Trace free fluid  IMPRESSION:  1.  Single uterine  gestational sac with embryo.  No cardiac activity but this is not unexpected in embryo this size. Differential includes early intrauterine pregnancy, spontaneous abortion in progress, and less likely ectopic pregnancy.  Recommend follow-up ultrasound in 10 days. 2.  Estimated gestational age by crown-rump length equals 6 weeks 0 days.  Original Report Authenticated By: Genevive Bi, M.D.   US Ob Transvaginal  03/24/2011  *RADIOLOGY REPORT*  Clinical Data: Back pain  OBSTETRIC <14 WK ULTRASOUND  Technique:  Transabdominal ultrasound was performed for evaluation of the gestation as well as the maternal uterus and adnexal regions.  Comparison:  None.  Intrauterine gestational sac: Single Yolk sac: Not identified Embryo: Present Cardiac Activity: Not identified Heart Rate: Not identified bpm  CRL:  3.4 mm  6w   zerod            [place] EDC: No  Maternal uterus/Adnexae: There is a corpus luteal cyst in the right ovary.  Left ovary is normal.  Trace free fluid  IMPRESSION:  1.  Single uterine gestational sac with embryo.  No cardiac activity but this is not unexpected in embryo this size. Differential includes early intrauterine pregnancy, spontaneous abortion in progress, and less likely ectopic pregnancy.  Recommend follow-up ultrasound in 10 days. 2.  Estimated gestational age by crown-rump length equals 6 weeks 0 days.  Original Report Authenticated By: Genevive Bi, M.D.    ASSESSMENT Early IUP [redacted]w[redacted]d by U/S   PLAN Discussed u/s findings with Dr Renaldo Fiddler D/C home F/U U/S with early prenatal care Return to MAU as needed

## 2011-03-24 NOTE — Discharge Instructions (Signed)
Near-Syncope Near-syncope is sudden weakness, dizziness, or feeling like you might pass out (faint). This can happen when getting up or while standing for a long time. It can be caused by a drop in blood pressure. It is common in people taking medicine for blood pressure. Fainting can happen when the blood pressure or pulse is too low. HOME CARE  If you feel like you are going to pass out:   Lie down right away.   Breathe deeply and steadily.   Move only when the feeling has gone away. Most of the time, this feeling lasts only a few minutes. You may feel tired for several hours.   Drink enough fluids to keep your pee (urine) clear or pale yellow.   If you are taking blood pressure or heart medicine, stand up slowly.  GET HELP RIGHT AWAY IF:   You have a severe headache.   Unusual pain develops in the chest, belly (abdomen), or back.   You have bleeding from the mouth or butt (rectum), or you have black or tarry poop (stool).   You feel your heart beat differently than normal, or you have a very fast pulse.   You pass out, or you twitch and shake when you pass out.   You pass out when sitting or lying down.   You feel confused.   You have trouble walking.   You are weak.   You have vision problems.  MAKE SURE YOU:   Understand these instructions.   Will watch your condition.   Will get help right away if you are not doing well or get worse.  Document Released: 06/12/2007 Document Revised: 12/13/2010 Document Reviewed: 02/09/2010 Exit[employer] Patient Information 2012 [employer]. 

## 2011-03-24 NOTE — MAU Provider Note (Signed)
Chart reviewed and agree with management and plan.  

## 2011-03-24 NOTE — MAU Note (Signed)
Patient is here stating that she was told to come directly to MAU from [facility] for an ultrasound. She states that she was evaluated for falling last night, she received iv fluids, pelvic exam and cultures. She c/o mid right abdominal dull pain. Denies vaginal bleeding or discharge. She has an 52 months old baby and is breastfeeding.

## 2011-03-24 NOTE — Discharge Instructions (Signed)
Pregnancy - First Trimester  During sexual intercourse, millions of sperm go into the vagina. Only 1 sperm will penetrate and fertilize the female egg while it is in the Fallopian tube. One week later, the fertilized egg implants into the wall of the uterus. An embryo begins to develop into a baby. At 6 to 8 weeks, the eyes and face are formed and the heartbeat can be seen on ultrasound. At the end of 12 weeks (first trimester), all the baby's organs are formed. Now that you are pregnant, you will want to do everything you can to have a healthy baby. Two of the most important things are to get good prenatal care and follow your caregiver's instructions. Prenatal care is all the medical care you receive before the baby's birth. It is given to prevent, find, and treat problems during the pregnancy and childbirth.  PRENATAL EXAMS   During prenatal visits, your weight, blood pressure and urine are checked. This is done to make sure you are healthy and progressing normally during the pregnancy.   A pregnant woman should gain 25 to 35 pounds during the pregnancy. However, if you are over weight or underweight, your caregiver will advise you regarding your weight.   Your caregiver will ask and answer questions for you.   Blood work, cervical cultures, other necessary tests and a Pap test are done during your prenatal exams. These tests are done to check on your health and the probable health of your baby. Tests are strongly recommended and done for HIV with your permission. This is the virus that causes AIDS. These tests are done because medications can be given to help prevent your baby from being born with this infection should you have been infected without knowing it. Blood work is also used to find out your blood type, previous infections and follow your blood levels (hemoglobin).   Low hemoglobin (anemia) is common during pregnancy. Iron and vitamins are given to help prevent this. Later in the pregnancy, blood  tests for diabetes will be done along with any other tests if any problems develop. You may need tests to make sure you and the baby are doing well.   You may need other tests to make sure you and the baby are doing well.  CHANGES DURING THE FIRST TRIMESTER (THE FIRST 3 MONTHS OF PREGNANCY)  Your body goes through many changes during pregnancy. They vary from person to person. Talk to your caregiver about changes you notice and are concerned about. Changes can include:   Your menstrual period stops.   The egg and sperm carry the genes that determine what you look like. Genes from you and your partner are forming a baby. The female genes determine whether the baby is a boy or a girl.   Your body increases in girth and you may feel bloated.   Feeling sick to your stomach (nauseous) and throwing up (vomiting). If the vomiting is uncontrollable, call your caregiver.   Your breasts will begin to enlarge and become tender.   Your nipples may stick out more and become darker.   The need to urinate more. Painful urination may mean you have a bladder infection.   Tiring easily.   Loss of appetite.   Cravings for certain kinds of food.   At first, you may gain or lose a couple of pounds.   You may have changes in your emotions from day to day (excited to be pregnant or concerned something may go wrong with   the pregnancy and baby).   You may have more vivid and strange dreams.  HOME CARE INSTRUCTIONS    It is very important to avoid all smoking, alcohol and un-prescribed drugs during your pregnancy. These affect the formation and growth of the baby. Avoid chemicals while pregnant to ensure the delivery of a healthy infant.   Start your prenatal visits by the 12th week of pregnancy. They are usually scheduled monthly at first, then more often in the last 2 months before delivery. Keep your caregiver's appointments. Follow your caregiver's instructions regarding medication use, blood and lab tests, exercise, and  diet.   During pregnancy, you are providing food for you and your baby. Eat regular, well-balanced meals. Choose foods such as meat, fish, milk and other low fat dairy products, vegetables, fruits, and whole-grain breads and cereals. Your caregiver will tell you of the ideal weight gain.   You can help morning sickness by keeping soda crackers at the bedside. Eat a couple before arising in the morning. You may want to use the crackers without salt on them.   Eating 4 to 5 small meals rather than 3 large meals a day also may help the nausea and vomiting.   Drinking liquids between meals instead of during meals also seems to help nausea and vomiting.   A physical sexual relationship may be continued throughout pregnancy if there are no other problems. Problems may be early (premature) leaking of amniotic fluid from the membranes, vaginal bleeding, or belly (abdominal) pain.   Exercise regularly if there are no restrictions. Check with your caregiver or physical therapist if you are unsure of the safety of some of your exercises. Greater weight gain will occur in the last 2 trimesters of pregnancy. Exercising will help:   Control your weight.   Keep you in shape.   Prepare you for labor and delivery.   Help you lose your pregnancy weight after you deliver your baby.   Wear a good support or jogging bra for breast tenderness during pregnancy. This may help if worn during sleep too.   Ask when prenatal classes are available. Begin classes when they are offered.   Do not use hot tubs, steam rooms or saunas.   Wear your seat belt when driving. This protects you and your baby if you are in an accident.   Avoid raw meat, uncooked cheese, cat litter boxes and soil used by cats throughout the pregnancy. These carry germs that can cause birth defects in the baby.   The first trimester is a good time to visit your dentist for your dental health. Getting your teeth cleaned is OK. Use a softer toothbrush and brush  gently during pregnancy.   Ask for help if you have financial, counseling or nutritional needs during pregnancy. Your caregiver will be able to offer counseling for these needs as well as refer you for other special needs.   Do not take any medications or herbs unless told by your caregiver.   Inform your caregiver if there is any mental or physical domestic violence.   Make a list of emergency phone numbers of family, friends, hospital, and police and fire departments.   Write down your questions. Take them to your prenatal visit.   Do not douche.   Do not cross your legs.   If you have to stand for long periods of time, rotate you feet or take small steps in a circle.   You may have more vaginal secretions that may   require a sanitary pad. Do not use tampons or scented sanitary pads.  MEDICATIONS AND DRUG USE IN PREGNANCY   Take prenatal vitamins as directed. The vitamin should contain 1 milligram of folic acid. Keep all vitamins out of reach of children. Only a couple vitamins or tablets containing iron may be fatal to a baby or young child when ingested.   Avoid use of all medications, including herbs, over-the-counter medications, not prescribed or suggested by your caregiver. Only take over-the-counter or prescription medicines for pain, discomfort, or fever as directed by your caregiver. Do not use aspirin, ibuprofen, or naproxen unless directed by your caregiver.   Let your caregiver also know about herbs you may be using.   Alcohol is related to a number of birth defects. This includes fetal alcohol syndrome. All alcohol, in any form, should be avoided completely. Smoking will cause low birth rate and premature babies.   Street or illegal drugs are very harmful to the baby. They are absolutely forbidden. A baby born to an addicted mother will be addicted at birth. The baby will go through the same withdrawal an adult does.   Let your caregiver know about any medications that you have to take  and for what reason you take them.  MISCARRIAGE IS COMMON DURING PREGNANCY  A miscarriage does not mean you did something wrong. It is not a reason to worry about getting pregnant again. Your caregiver will help you with questions you may have. If you have a miscarriage, you may need minor surgery.  SEEK MEDICAL CARE IF:   You have any concerns or worries during your pregnancy. It is better to call with your questions if you feel they cannot wait, rather than worry about them.  SEEK IMMEDIATE MEDICAL CARE IF:    An unexplained oral temperature above 102 F (38.9 C) develops, or as your caregiver suggests.   You have leaking of fluid from the vagina (birth canal). If leaking membranes are suspected, take your temperature and inform your caregiver of this when you call.   There is vaginal spotting or bleeding. Notify your caregiver of the amount and how many pads are used.   You develop a bad smelling vaginal discharge with a change in the color.   You continue to feel sick to your stomach (nauseated) and have no relief from remedies suggested. You vomit blood or coffee ground-like materials.   You lose more than 2 pounds of weight in 1 week.   You gain more than 2 pounds of weight in 1 week and you notice swelling of your face, hands, feet, or legs.   You gain 5 pounds or more in 1 week (even if you do not have swelling of your hands, face, legs, or feet).   You get exposed to German measles and have never had them.   You are exposed to fifth disease or chickenpox.   You develop belly (abdominal) pain. Round ligament discomfort is a common non-cancerous (benign) cause of abdominal pain in pregnancy. Your caregiver still must evaluate this.   You develop headache, fever, diarrhea, pain with urination, or shortness of breath.   You fall or are in a car accident or have any kind of trauma.   There is mental or physical violence in your home.  Document Released: 12/18/2000 Document Revised: 12/13/2010  Document Reviewed: 06/21/2008  Exit[employer] Patient Information 2012 [employer].

## 2011-03-25 LAB — GC/CHLAMYDIA PROBE AMP, GENITAL: GC Probe Amp, Genital: NEGATIVE

## 2011-04-03 ENCOUNTER — Inpatient Hospital Stay (HOSPITAL_COMMUNITY): Payer: Self-pay

## 2011-04-03 ENCOUNTER — Encounter (HOSPITAL_COMMUNITY): Payer: Self-pay | Admitting: *Deleted

## 2011-04-03 ENCOUNTER — Inpatient Hospital Stay (HOSPITAL_COMMUNITY)
Admission: AD | Admit: 2011-04-03 | Discharge: 2011-04-03 | Disposition: A | Discharge status: Home / Self Care | Payer: Self-pay | Source: Ambulatory Visit | Attending: Family Medicine | Admitting: Family Medicine

## 2011-04-03 DIAGNOSIS — O021 Missed abortion: Secondary | ICD-10-CM | POA: Insufficient documentation

## 2011-04-03 LAB — CBC
Hemoglobin: 12.4 g/dL (ref 12.0–15.0)
MCH: 27.6 pg (ref 26.0–34.0)
MCHC: 32.4 g/dL (ref 30.0–36.0)
MCV: 85.1 fL (ref 78.0–100.0)
RBC: 4.5 MIL/uL (ref 3.87–5.11)

## 2011-04-03 MED ORDER — HYDROXYZINE PAMOATE 25 MG PO CAPS: 25.0000 mg | ORAL_CAPSULE | Freq: Every evening | ORAL | Status: AC | PRN

## 2011-04-03 NOTE — Progress Notes (Signed)
Clinical Social Work Department BRIEF PSYCHOSOCIAL ASSESSMENT 04/03/2011  Patient:  Megan [name], Megan [name]     Account Number:  [id number]     Admit date:  04/03/2011  Clinical Social Worker:  Andy Gauss  Date/Time:  04/03/2011 01:00 PM  Referred by:  Physician  Date Referred:  04/03/2011 Referred for  Behavioral Health Issues   Other Referral:   Questionable depression/anxiety   Interview type:  Patient Other interview type:    PSYCHOSOCIAL DATA Living Status:  FAMILY Admitted from facility:   Level of care:   Primary support name:   Primary support relationship to patient:  SPOUSE Degree of support available:   Involved    CURRENT CONCERNS  Other Concerns:   Anxiety    SOCIAL WORK ASSESSMENT / PLAN Sw referral received to assess pt's current situation, after pt told RN that she cries often.  Pt told Sw that she gets "nervous" when she hear "negative" things, cries and falls to the floor.  When Sw asked pt to elaborate on the "negative things," pt could not verbalize.  She did have an uncle to pass away one month ago, and contributes some of her sadness to the loss.  She has lived in this country for about 7 years but often becomes depressed when she thinks about her family back home, in [place].  This pregnancy was not planned. When asked about her feelings towards this pregnancy, pt responded stating, "I have to be happy."  Pt seems to be experiencing some anxiety/ depression symptoms.  She is willing to speak with a counselor and/or take medication, "if it will help."  Lately, her panic attacks are more frequent.  Her spouse is supportive, as per the pt.  She denies any history of SI/HI.  Sw gave pt 3 options for counseling agencies.  Sw talked with the NP about starting an anti-anxiety medication.   Assessment/plan status:   Other assessment/ plan:   Information/referral to community resources:    PATIENT'S/FAMILY'S RESPONSE TO PLAN OF CARE: Pt thanked Sw for resources.

## 2011-04-03 NOTE — MAU Note (Signed)
Pt here for follow up ultrasound.  Was seen on the 17th, had a gestational sac, no heart beat seen.  Reports spotting since yesterday. Mild abdominal cramping.

## 2011-04-03 NOTE — MAU Provider Note (Signed)
Chart reviewed and agree with management and plan.  

## 2011-04-03 NOTE — MAU Note (Signed)
Upon further assessment, pt husband states that pt is very sad all the time and that she cries a lot.  States she throws herself on the floor.  Does not say that she is suicidal or expressing any ideas of hurting herself or others.

## 2011-04-03 NOTE — Discharge Instructions (Signed)
SOMEONE WILL CALL YOU FROM THE GYN OFFICE TO SCHEDULE A FOLLOW UP APPOINTMENT. IF YOU HAVE PROBLEMS BEFORE THEN, RETURN HERE. IF YOU NEED TO CALL THE GYN OFFICE THE NUMBER IS [phone].

## 2011-04-03 NOTE — MAU Provider Note (Signed)
History     CSN: [id number]  Arrival date & time 04/03/11  1106   None     No chief complaint on file.  HPI Megan [name] is a 41 y.o. female @ [redacted]w[redacted]d gestation who presents to MAU for bleeding in early pregnancy. She was here 10 days ago and had an ultrasound that showed a 6 week IUP but no cardiac activity. It was recommended that she follow up in 10 days for viability. Today she reports vaginal bleeding that is light and cramping in lower back and lower abdomen. She also reports feeling anxious.  History reviewed. No pertinent past medical history.  Past Surgical History  Procedure Date  . Cesarean section     History reviewed. No pertinent family history.  History  Substance Use Topics  . Smoking status: Never Smoker   . Smokeless tobacco: Not on file  . Alcohol Use: No    OB History    Grav Para Term Preterm Abortions TAB SAB Ect Mult Living   6 4 4  1  1   4       Review of Systems  Constitutional: Positive for appetite change.  HENT: Negative.   Eyes: Negative.   Respiratory: Negative.   Cardiovascular: Negative.   Gastrointestinal: Positive for abdominal pain (cramping). Negative for nausea, vomiting and diarrhea.  Genitourinary: Positive for frequency and vaginal bleeding. Negative for dysuria and difficulty urinating.  Musculoskeletal: Positive for back pain.  Skin: Negative.   Neurological: Negative for dizziness and headaches.  Psychiatric/Behavioral: The patient is nervous/anxious.     Allergies  Review of patient's allergies indicates no known allergies.  Home Medications  No current outpatient prescriptions on file.  BP 136/72  Pulse 88  Temp(Src) 97.1 F (36.2 C) (Oral)  Resp 18  Ht 5' 3.5" (1.613 m)  Wt 190 lb 8 oz (86.41 kg)  BMI 33.22 kg/m2  LMP 02/01/2011  Physical Exam  Nursing note and vitals reviewed. Constitutional: She is oriented to person, place, and time. She appears well-developed and well-nourished. No distress.  HENT:    Head: Normocephalic.  Eyes: EOM are normal.  Neck: Neck supple.  Cardiovascular: Normal rate.   Pulmonary/Chest: Effort normal.  Abdominal: Soft. There is no tenderness.  Musculoskeletal: Normal range of motion.  Neurological: She is alert and oriented to person, place, and time. No cranial nerve deficit.  Skin: Skin is warm and dry.  Psychiatric: Her speech is normal. Thought content normal. Her mood appears anxious. Cognition and memory are normal.   Child psychotherapist in to discuss referral for patient for anxiety.  Patient feeling much better after results of ultrasound discussed with her. She was concerned about being pregnant again and also about previous ultrasound.  Assessment: Failed pregnancy @ [redacted] weeks gestation   Anxiety  Plan:  Expectant management   Ibuprofen for cramping   Follow up in GYN Clinic in 2 weeks   Return here sooner for problems.   Vistaril 25 mg po hs Rx   Follow up with mental health      ED Course: Ultrasound shows failed early IUP, no progression since previous ultrasound 10 days ago.  Procedures   MDM

## 2011-04-04 ENCOUNTER — Encounter: Payer: Self-pay | Admitting: *Deleted

## 2011-04-17 ENCOUNTER — Encounter: Payer: Self-pay | Admitting: Advanced Practice Midwife

## 2011-04-17 ENCOUNTER — Ambulatory Visit (INDEPENDENT_AMBULATORY_CARE_PROVIDER_SITE_OTHER): Payer: Self-pay | Admitting: Advanced Practice Midwife

## 2011-04-17 VITALS — BP 116/76 | HR 77 | Temp 98.0°F | Ht 65.0 in | Wt 162.1 lb

## 2011-04-17 DIAGNOSIS — O021 Missed abortion: Secondary | ICD-10-CM

## 2011-04-17 LAB — CBC
HCT: 38.9 % (ref 36.0–46.0)
Hemoglobin: 12.4 g/dL (ref 12.0–15.0)
MCH: 27.6 pg (ref 26.0–34.0)
MCHC: 31.9 g/dL (ref 30.0–36.0)
MCV: 86.6 fL (ref 78.0–100.0)
RDW: 13.3 % (ref 11.5–15.5)

## 2011-04-17 NOTE — Progress Notes (Signed)
Still bleeding from 04/03/11.  "Has to change pads 3-4 x day" Pt needs another RX for anxiety due to vistaril not able to take while breastfeeding

## 2011-04-18 ENCOUNTER — Ambulatory Visit (HOSPITAL_COMMUNITY)
Admission: RE | Admit: 2011-04-18 | Discharge: 2011-04-18 | Disposition: A | Discharge status: Home / Self Care | Payer: Self-pay | Source: Ambulatory Visit | Attending: Advanced Practice Midwife | Admitting: Advanced Practice Midwife

## 2011-04-18 ENCOUNTER — Encounter: Payer: Self-pay | Admitting: Obstetrics and Gynecology

## 2011-04-18 DIAGNOSIS — O021 Missed abortion: Secondary | ICD-10-CM | POA: Insufficient documentation

## 2011-04-18 NOTE — Progress Notes (Unsigned)
  Called by Radiology department with results of ultrasound. Patient was originally seen on 04/03/2011 in MAU with vaginal bleeding in pregnancy. Patient was diagnosed with miscarriage and treated with expectant management. Patient reports that she continued to bleed daily (changing 3-4 pads per day) since 3/27. Repeat ultrasound today demonstrate an irregularly shaped intrauterine gestational sac with subchorionic hemorrhage. Patient was counseled and treatment options of D&E or cytotec were discussed. Patient does not desire any interventions at this time and wishes to continue with expectant management.  Patient is scheduled to follow-up in GYN clinic in 1-2 weeks. In the meantime, patient was advised to return to MAU if bleeding becomes so heavy that she is changing a pad every hour, she experiences fever or if she becomes dizzy/lightheaded or has chest pain. Patient verbalized understanding and all questions were answered.

## 2011-04-23 NOTE — Progress Notes (Signed)
  Subjective:    Patient ID: Megan [name], female    DOB: 1970/05/25, 41 y.o.   MRN: [id number]  HPI: Here for F/U from MAU visit 04/03/11. Dx missed AB. Moderate bleeding continues, changing 3-4 pads per day. No abd pain, dizziness, fever, chills. Unsure if she has passed tissue.   Review of Systems: otherwise neg     Objective:   Physical Exam: NAD, A&Ox4 Abd: NT, soft Pelvic: Moderate DRB, normal odor. Uterus NT. Cervix closed     Assessment & Plan:  Likely incomplete AB, stable Pelvic US Quant CBC Bleeding precautions F/U in 1-2 weeks or MAU PRN for heavy bleeding, dizziness or fever  [place], Husein Guedes

## 2011-04-24 ENCOUNTER — Ambulatory Visit: Payer: Self-pay | Admitting: Advanced Practice Midwife

## 2011-04-29 ENCOUNTER — Encounter: Payer: Self-pay | Admitting: Obstetrics & Gynecology

## 2011-04-29 ENCOUNTER — Ambulatory Visit (INDEPENDENT_AMBULATORY_CARE_PROVIDER_SITE_OTHER): Payer: Self-pay | Admitting: Obstetrics & Gynecology

## 2011-04-29 VITALS — BP 117/72 | HR 69 | Temp 98.5°F | Ht 64.96 in | Wt 191.3 lb

## 2011-04-29 DIAGNOSIS — O021 Missed abortion: Secondary | ICD-10-CM

## 2011-04-29 MED ORDER — MISOPROSTOL 200 MCG PO TABS: ORAL_TABLET | ORAL | Status: DC

## 2011-04-29 NOTE — Patient Instructions (Signed)
Incomplete Miscarriage  Miscarriages in pregnancy are common. A miscarriage is a pregnancy that has ended before the twentieth week. You have had an incomplete miscarriage. Partial parts of the fetus or placenta (afterbirth) remain behind. Sometimes further treatment is needed. The most common reason for further treatment is continued bleeding (hemorrhage). Tissue left behind may also become infected. Treatment usually is curettage. Curettage for an incomplete abortion is a procedure in which the remaining products of pregnancy are removed. This can be done by a simple sucking procedure (suction curettage). It can also be done by a simple scraping (curettage) of the inside of the uterus (womb). This may be done in the hospital or in the caregiver's office. This is only done when your caregiver knows the pregnancy has ended. This is determined by physical examination and a negative pregnancy test. It may also include an ultrasound to confirm a dead fetus. The ultrasound may also prove that products of the pregnancy remain in the uterus.  If your cervix remains dilated and you are still passing clots and tissue, your caregiver may wish to watch you for a little while. Your caregiver may want to see if you are going to finish passing all of the remaining parts of the pregnancy. If the bleeding continues, they may proceed with curettage.  WHY DO I FEEL THIS WAY  Miscarriages can be a very emotional time for prospective mothers. This is not you or your partner's fault. The miscarriage did not occur because of a lack in you or your partner. Nearly all miscarriages occur because the pregnancy has started off wrongly. At least half of miscarried pregnancies have a chromosomal abnormality (almost always not inherited). Others may have developmental problems with the fetus or placentas. Problems may not show up even when the products miscarried are studied under the microscope. You can usually begin trying for another  pregnancy as soon as your caregiver says it's okay.  HOME CARE INSTRUCTIONS    Your caregiver may order bed rest (this means only getting up to use the bathroom). Your caregiver may allow you to continue light activity. If curettage was not done at this time, but you require further treatment.   Keep track of the number of pads you use each day. Keep track of how saturated (soaked) they are. Record this information.   Do not use tampons. Do not douche or have sexual intercourse until approved by your caregiver.   It is very important to keep all follow-up appointments for re-evaluation and continuing management.   Women who have an Rh negative blood type (ie, A, B, AB, or O negative) need to receive a drug called Rh(D) immune globulin. This medicine helps protect future fetuses against problems that can occur if an Rh negative mother is carrying a baby who is Rh positive.  SEEK IMMEDIATE MEDICAL CARE IF:    You experience severe cramps in your stomach, back, or abdomen.   You run an unexplained temperature (record these).   You pass large clots or tissue (save any tissue for your caregiver to inspect).   Your bleeding increases or you become light-headed, weak, or have fainting episodes.  MAKE SURE YOU:    Understand these instructions.   Will watch your condition.   Will get help right away if you are not doing well or get worse.  Document Released: 12/24/2004 Document Revised: 12/13/2010 Document Reviewed: 08/14/2007  [employer] Patient Information 2012 [employer].

## 2011-04-29 NOTE — Progress Notes (Signed)
  Subjective:    Patient ID: Megan [name], female    DOB: 26-Sep-1970, 41 y.o.   MRN: [id number]  HPI [id number] Patient's last menstrual period was 02/01/2011. Patient still has vaginal bleeding after a incomplete miscarriage was diagnosed. She was seen last week and the cervix was closed. She says the bleeding has decreased but is still moderate. She has some mild back pain and right lower quadrant pain.  No past medical history on file. Past Surgical History  Procedure Date  . Cesarean section    Current Outpatient Prescriptions on File Prior to Visit  Medication Sig Dispense Refill  . hydrOXYzine (ATARAX/VISTARIL) 25 MG tablet Take 25 mg by mouth at bedtime.      . IRON PO Take 1 tablet by mouth daily. Patient states the Iron dosage is 27mg        . misoprostol (CYTOTEC) 200 MCG tablet 2 by mouth and 2 per vagina single dose  4 tablet  0  . Prenatal Vit-Fe Fumarate-FA (PRENATAL MULTIVITAMIN) TABS Take 1 tablet by mouth daily.       No Known Allergies   Review of Systems    vaginal bleeding lower back and lower bowel pain. Objective:   Physical Exam  Filed Vitals:   04/29/11 1317  BP: 117/72  Pulse: 69  Temp: 98.5 F (36.9 C)  TempSrc: Oral  Height: 5' 4.96" (1.65 m)  Weight: 191 lb 4.8 oz (86.773 kg)   No acute distress normal affect Abdomen soft nontender no mass Pelvic: External genitalia normal. Moderate blood in the vault. Cervix appears to be about 1 cm open. Small amount of tissue is palpable at the internal os. Uterus is 4-6 weeks size nontender no mass      Assessment & Plan:  Incomplete abortion. I offered Cytotec which she will try. She'll take 400 mg per vagina and by mouth single dose. She return in one week. She understands that if she does not complete the miscarriage that a D&C may be necessary. Scheryl Darter 04/29/2011 2:17 PM

## 2011-04-30 ENCOUNTER — Telehealth: Payer: Self-pay | Admitting: *Deleted

## 2011-04-30 NOTE — Telephone Encounter (Signed)
Pt left message stating that she has questions about her Rx.   I returned her call and she stated that when her husband picked up her Rx last night (cytotec), there was only 1 pill and there should have been 4.  I spoke with the pharmacist @ [facility] and he stated that she can come back to pick up the remaining 3 tabs.  I informed pt and also reviewed the administration instructions of taking 2 tabs by mouth and placing 2 tabs in the vagina. I advised that she do this @ night. Pt voiced understanding.

## 2011-05-01 ENCOUNTER — Telehealth: Payer: Self-pay | Admitting: *Deleted

## 2011-05-01 NOTE — Telephone Encounter (Signed)
Pt left message stating that she is @ the pharmacy and has a question about her Rx.   Please call back.

## 2011-05-01 NOTE — Telephone Encounter (Signed)
I returned pt's call this morning and she told me that she no longer has a question. She administered the cytotec as prescribed last night- 2 tabs po and 2 tabs per vagina. She states that she has not felt anything yet as far as abd cramping or bleeding. I told pt that we will follow up @ her next appt on 05/09/11 @ 1400. She may call us back for any questions or problems. Pt voiced understanding.

## 2011-05-09 ENCOUNTER — Encounter: Payer: Self-pay | Admitting: Obstetrics & Gynecology

## 2011-05-09 ENCOUNTER — Ambulatory Visit (INDEPENDENT_AMBULATORY_CARE_PROVIDER_SITE_OTHER): Payer: Self-pay | Admitting: Obstetrics & Gynecology

## 2011-05-09 VITALS — BP 120/77 | HR 72 | Temp 98.5°F | Ht 65.0 in | Wt 192.2 lb

## 2011-05-09 DIAGNOSIS — O021 Missed abortion: Secondary | ICD-10-CM

## 2011-05-09 NOTE — Progress Notes (Signed)
  Subjective:    Patient ID: Megan [name], female    DOB: 10-20-1970, 40 y.o.   MRN: [id number]  [id number] Patient's last menstrual period was 02/01/2011. She used the cytotec prescribed for incomplete abortion, and she passed tissue and bleeding has lessened. No pain, fever, abnormal discharge. She would like a Mirena, which she has used before.  No past medical history on file. Past Surgical History  Procedure Date  . Cesarean section    Current outpatient prescriptions:phenylephrine (SUDAFED PE) 10 MG TABS, Take 10 mg by mouth every 4 (four) hours as needed., Disp: , Rfl: ;  hydrOXYzine (ATARAX/VISTARIL) 25 MG tablet, Take 25 mg by mouth at bedtime., Disp: , Rfl: ;  IRON PO, Take 1 tablet by mouth daily. Patient states the Iron dosage is 27mg  , Disp: , Rfl: ;  Prenatal Vit-Fe Fumarate-FA (PRENATAL MULTIVITAMIN) TABS, Take 1 tablet by mouth daily., Disp: , Rfl:   Review of Systems    as above Objective:   Physical Exam Filed Vitals:   05/09/11 1430  Height: 5\' 5"  (1.651 m)  Weight: 192 lb 3.2 oz (87.181 kg)   Filed Vitals:   05/09/11 1430  BP: 120/77  Pulse: 72  Temp: 98.5 F (36.9 C)  Height: 5\' 5"  (1.651 m)  Weight: 192 lb 3.2 oz (87.181 kg)   NAD pleasant Abdomen not tender and no mass Pelvic EBUS, vagina normal, small blood, no tissue, cervix closed uterus nl size        Assessment & Plan:  Complete abortion, no complications. Arrange for Mirena.  Alvin Rubano 05/09/2011 2:56 PM

## 2011-05-09 NOTE — Patient Instructions (Signed)

## 2011-08-12 DIAGNOSIS — J069 Acute upper respiratory infection, unspecified: Secondary | ICD-10-CM | POA: Insufficient documentation

## 2011-08-13 ENCOUNTER — Encounter (HOSPITAL_COMMUNITY): Payer: Self-pay | Admitting: *Deleted

## 2011-08-13 ENCOUNTER — Emergency Department (HOSPITAL_COMMUNITY)
Admission: EM | Admit: 2011-08-13 | Discharge: 2011-08-13 | Disposition: A | Discharge status: Home / Self Care | Payer: Self-pay | Attending: Emergency Medicine | Admitting: Emergency Medicine

## 2011-08-13 DIAGNOSIS — J069 Acute upper respiratory infection, unspecified: Secondary | ICD-10-CM

## 2011-08-13 MED ORDER — PSEUDOEPHEDRINE HCL ER 120 MG PO TB12
120.0000 mg | ORAL_TABLET | Freq: Two times a day (BID) | ORAL | Status: DC
  Administered 2011-08-13: 120 mg via ORAL
  Filled 2011-08-13 (×3): qty 1

## 2011-08-13 MED ORDER — PSEUDOEPHEDRINE HCL ER 120 MG PO TB12: 120.0000 mg | ORAL_TABLET | Freq: Two times a day (BID) | ORAL | Status: DC

## 2011-08-13 NOTE — ED Notes (Signed)
Pt reports that she has been having nonproductive cough, runny nose, sneezing, watery eyes and left ear ache since Sunday.  Pt states that she has been taking Claritin and Ibuprofen but it did not improve symptoms, states symptoms worse today.  Denies chest pain.

## 2011-08-13 NOTE — ED Provider Notes (Signed)
History     CSN: [id number]  Arrival date & time 08/12/11  2348   None     Chief Complaint  Patient presents with  . URI    (Consider location/radiation/quality/duration/timing/severity/associated sxs/prior treatment) HPI Comments: URI symptoms for the past 3 days taking Ibuprofen without relief   Patient is a 41 y.o. female presenting with URI. The history is provided by the patient.  URI The primary symptoms include ear pain. Primary symptoms do not include fever, headaches, sore throat, cough, wheezing, nausea or myalgias. The current episode started 2 days ago. This is a new problem. The problem has not changed since onset. Symptoms associated with the illness include facial pain, sinus pressure and rhinorrhea. The illness is not associated with chills.    History reviewed. No pertinent past medical history.  Past Surgical History  Procedure Date  . Cesarean section     No family history on file.  History  Substance Use Topics  . Smoking status: Never Smoker   . Smokeless tobacco: Not on file  . Alcohol Use: No    OB History    Grav Para Term Preterm Abortions TAB SAB Ect Mult Living   6 4 4  1  1   4       Review of Systems  Constitutional: Negative for fever and chills.  HENT: Positive for ear pain, rhinorrhea and sinus pressure. Negative for sore throat.   Respiratory: Negative for cough and wheezing.   Gastrointestinal: Negative for nausea.  Musculoskeletal: Negative for myalgias.  Neurological: Negative for dizziness and headaches.    Allergies  Review of patient's allergies indicates no known allergies.  Home Medications   Current Outpatient Rx  Name Route Sig Dispense Refill  . IBUPROFEN 100 MG PO TABS Oral Take 100 mg by mouth every 6 (six) hours as needed. For pain    . LORATADINE 10 MG PO TABS Oral Take 10 mg by mouth daily.    Marland Kitchen PSEUDOEPHEDRINE HCL ER 120 MG PO TB12 Oral Take 1 tablet (120 mg total) by mouth 2 (two) times daily. 20 tablet 0      BP 103/69  Pulse 58  Temp 99 F (37.2 C)  Resp 20  SpO2 97%  LMP 07/16/2011  Physical Exam  Constitutional: She is oriented to person, place, and time. She appears well-developed and well-nourished.  HENT:  Head: Normocephalic.  Mouth/Throat: No oropharyngeal exudate.  Eyes: Pupils are equal, round, and reactive to light.  Neck: Normal range of motion.  Musculoskeletal: Normal range of motion.  Neurological: She is alert and oriented to person, place, and time.  Skin: Skin is warm. No rash noted.    ED Course  Procedures (including critical care time)  Labs Reviewed - No data to display No results found.   1. URI, acute       MDM  URI symptoms will treat with Sudafed        Arman Filter, NP 08/13/11 0321  Arman Filter, NP 08/13/11 [phone]

## 2011-08-13 NOTE — ED Provider Notes (Signed)
Medical screening examination/treatment/procedure(s) were performed by non-physician practitioner and as supervising physician I was immediately available for consultation/collaboration.   Hanley Seamen, MD 08/13/11 [phone]

## 2011-08-13 NOTE — ED Notes (Signed)
Pt c/o sinus congestion; cough; left earache; sneezing/runny nose

## 2012-01-21 ENCOUNTER — Emergency Department (INDEPENDENT_AMBULATORY_CARE_PROVIDER_SITE_OTHER)
Admission: EM | Admit: 2012-01-21 | Discharge: 2012-01-21 | Disposition: A | Discharge status: Home / Self Care | Payer: No Typology Code available for payment source | Source: Home / Self Care | Attending: Family Medicine | Admitting: Family Medicine

## 2012-01-21 ENCOUNTER — Encounter (HOSPITAL_COMMUNITY): Payer: Self-pay | Admitting: *Deleted

## 2012-01-21 DIAGNOSIS — R002 Palpitations: Secondary | ICD-10-CM

## 2012-01-21 LAB — POCT I-STAT, CHEM 8
BUN: 13 mg/dL (ref 6–23)
Calcium, Ion: 1.23 mmol/L (ref 1.12–1.23)
Chloride: 106 mEq/L (ref 96–112)
Glucose, Bld: 88 mg/dL (ref 70–99)
TCO2: 26 mmol/L (ref 0–100)

## 2012-01-21 NOTE — ED Provider Notes (Signed)
History     CSN: [id number]  Arrival date & time 01/21/12  1526   First MD Initiated Contact with Patient 01/21/12 1656      Chief Complaint  Patient presents with  . Dizziness    (Consider location/radiation/quality/duration/timing/severity/associated sxs/prior treatment) Patient is a 42 y.o. female presenting with palpitations. The history is provided by the patient. No language interpreter was used.  Palpitations  This is a new problem. The current episode started more than 2 days ago (episodic). The problem has been gradually improving. Associated with: caffeine and tea. Associated symptoms include dizziness. She has tried nothing for the symptoms. The treatment provided no relief. There are no known risk factors.  Pt reports she tried a tea which she thinks may be making her feel bad.   Pt reports after praying today when she stood up she felt dizzy and her heart felt like it was racing  History reviewed. No pertinent past medical history.  Past Surgical History  Procedure Date  . Cesarean section     No family history on file.  History  Substance Use Topics  . Smoking status: Never Smoker   . Smokeless tobacco: Not on file  . Alcohol Use: No    OB History    Grav Para Term Preterm Abortions TAB SAB Ect Mult Living   6 4 4  1  1   4       Review of Systems  Cardiovascular: Positive for palpitations.  Neurological: Positive for dizziness.  All other systems reviewed and are negative.    Allergies  Review of patient's allergies indicates no known allergies.  Home Medications   Current Outpatient Rx  Name  Route  Sig  Dispense  Refill  . IBUPROFEN 100 MG PO TABS   Oral   Take 100 mg by mouth every 6 (six) hours as needed. For pain         . LORATADINE 10 MG PO TABS   Oral   Take 10 mg by mouth daily.         Marland Kitchen PSEUDOEPHEDRINE HCL ER 120 MG PO TB12   Oral   Take 1 tablet (120 mg total) by mouth 2 (two) times daily.   20 tablet   0     BP  122/80  Pulse 70  Temp 99 F (37.2 C) (Oral)  SpO2 100%  LMP 02/01/2011  Physical Exam  Nursing note and vitals reviewed. Constitutional: She is oriented to person, place, and time. She appears well-developed and well-nourished.  HENT:  Head: Normocephalic.  Right Ear: External ear normal.  Left Ear: External ear normal.  Eyes: Conjunctivae normal and EOM are normal. Pupils are equal, round, and reactive to light.  Neck: Normal range of motion. Neck supple.  Cardiovascular: Normal rate and normal heart sounds.   Pulmonary/Chest: Effort normal.  Abdominal: Soft. Bowel sounds are normal.  Musculoskeletal: Normal range of motion.  Neurological: She is alert and oriented to person, place, and time.  Skin: Skin is warm.  Psychiatric: She has a normal mood and affect.    ED Course  Procedures (including critical care time)   Labs Reviewed  POCT I-STAT, CHEM 8   No results found.   No diagnosis found.    MDM   Date: 01/21/2012  Rate: 70  Rhythm: normal sinus rhythm  QRS Axis: normal  Intervals: normal  ST/T Wave abnormalities: normal  Conduction Disutrbances:none  Narrative Interpretation:   Old EKG Reviewed: none available  I stat 8 normal,    Pt advised increase oral fluids, avoid caffeine.   Pt advised to return if any problems.   Probable pvc's from caffeine    Lonia Skinner [place], [place] 01/21/12 [phone]

## 2012-01-21 NOTE — ED Notes (Signed)
Pt reports had  Episode  Today             Of  Dizzy  And  Palpatations       Which  Started  sev  Days  Ago   She  Reports    She    Ingested  A  New  Tea  With  Caffeine        Which  She  Thinks  May  Have  Caused   Some  Symptoms   She  denys  Any  Chest  Pain      She  Did  Not  Black out

## 2012-01-27 NOTE — ED Provider Notes (Signed)
Medical screening examination/treatment/procedure(s) were performed by resident physician or non-physician practitioner and as supervising physician I was immediately available for consultation/collaboration.   Barkley Bruns MD.    Linna Hoff, MD 01/27/12 [phone]

## 2012-01-29 ENCOUNTER — Emergency Department (HOSPITAL_COMMUNITY)
Admission: EM | Admit: 2012-01-29 | Discharge: 2012-01-29 | Disposition: A | Discharge status: Home / Self Care | Payer: No Typology Code available for payment source | Source: Home / Self Care | Attending: Family Medicine | Admitting: Family Medicine

## 2012-01-29 ENCOUNTER — Encounter (HOSPITAL_COMMUNITY): Payer: Self-pay

## 2012-01-29 DIAGNOSIS — J069 Acute upper respiratory infection, unspecified: Secondary | ICD-10-CM

## 2012-01-29 DIAGNOSIS — J309 Allergic rhinitis, unspecified: Secondary | ICD-10-CM

## 2012-01-29 MED ORDER — IBUPROFEN 600 MG PO TABS: 600.0000 mg | ORAL_TABLET | Freq: Three times a day (TID) | ORAL | Status: DC | PRN

## 2012-01-29 MED ORDER — LORATADINE 10 MG PO TABS: 10.0000 mg | ORAL_TABLET | Freq: Every day | ORAL | Status: DC

## 2012-01-29 MED ORDER — DEXTROMETHORPHAN POLISTIREX 30 MG/5ML PO LQCR: 60.0000 mg | ORAL | Status: AC | PRN

## 2012-01-29 MED ORDER — PSEUDOEPHEDRINE HCL ER 120 MG PO TB12: 120.0000 mg | ORAL_TABLET | Freq: Two times a day (BID) | ORAL | Status: AC | PRN

## 2012-01-29 NOTE — ED Notes (Signed)
Complain of cough sneezing runny nose-started yesterday-pressure in front of head, chills

## 2012-01-29 NOTE — ED Provider Notes (Signed)
History    CSN: [id number]  Arrival date & time 01/29/12  1158   First MD Initiated Contact with Patient 01/29/12 1259     Chief Complaint  Patient presents with  . URI   HPI Pt reports that she is having runny nose and nasal congestion.  No coughing.  Pt is having sinus congestion, Pt says that she had some chills yesterday.   Pt reports that she is not having much appetite.  Pt says that she is breast feeding a 6 month old.  Pt says that she is sneezing.     History reviewed. No pertinent past medical history.  Past Surgical History  Procedure Date  . Cesarean section     No family history on file.  History  Substance Use Topics  . Smoking status: Never Smoker   . Smokeless tobacco: Not on file  . Alcohol Use: No    OB History    Grav Para Term Preterm Abortions TAB SAB Ect Mult Living   6 4 4  1  1   4       Review of Systems  Constitutional: Positive for fatigue.       Not sleeping well   HENT: Positive for congestion, rhinorrhea, sneezing and postnasal drip.   Eyes: Positive for redness.  All other systems reviewed and are negative.    Allergies  Review of patient's allergies indicates no known allergies.  Home Medications   Current Outpatient Rx  Name  Route  Sig  Dispense  Refill  . IBUPROFEN 100 MG PO TABS   Oral   Take 100 mg by mouth every 6 (six) hours as needed. For pain         . LORATADINE 10 MG PO TABS   Oral   Take 10 mg by mouth daily.         Marland Kitchen PSEUDOEPHEDRINE HCL ER 120 MG PO TB12   Oral   Take 1 tablet (120 mg total) by mouth 2 (two) times daily.   20 tablet   0     BP 97/72  Pulse 93  Temp 98.8 F (37.1 C) (Oral)  Resp 20  SpO2 100%  Physical Exam  Nursing note and vitals reviewed. Constitutional: She is oriented to person, place, and time. She appears well-developed and well-nourished. No distress.  HENT:  Head: Normocephalic and atraumatic.  Nose: Mucosal edema and rhinorrhea present.  Mouth/Throat: Mucous  membranes are normal. No oropharyngeal exudate, posterior oropharyngeal edema, posterior oropharyngeal erythema or tonsillar abscesses.  Eyes: EOM are normal. Pupils are equal, round, and reactive to light.  Neck: Normal range of motion. Neck supple. No thyromegaly present.  Cardiovascular: Normal rate, regular rhythm and normal heart sounds.   Pulmonary/Chest: Effort normal and breath sounds normal. No respiratory distress. She has no wheezes. She has no rales. She exhibits no tenderness.  Abdominal: Soft. Bowel sounds are normal.  Musculoskeletal: Normal range of motion.  Neurological: She is alert and oriented to person, place, and time. No cranial nerve deficit. Coordination normal.  Skin: Skin is warm and dry.  Psychiatric: She has a normal mood and affect. Her behavior is normal. Judgment and thought content normal.   ED Course  Procedures (including critical care time)  Labs Reviewed - No data to display No results found.  No diagnosis found.  MDM  IMPRESSION  URI  Allergic Rhinitis  Breastfeeding  RECOMMENDATIONS / PLAN Delsym prn cough and congestion Loratadine 10 mg po daily Pseudoephedrine prn severe nasal  congestion Ibuprofen 600 mg po every 8 hours prn   FOLLOW UP As needed   The patient was given clear instructions to go to ER or return to medical center if symptoms don't improve, worsen or new problems develop.  The patient verbalized understanding.  The patient was told to call to get lab results if they haven't heard anything in the next week.            Cleora Fleet, MD 01/29/12 [phone]

## 2013-09-08 ENCOUNTER — Ambulatory Visit (INDEPENDENT_AMBULATORY_CARE_PROVIDER_SITE_OTHER): Payer: Self-pay | Admitting: Family Medicine

## 2013-09-08 VITALS — BP 108/66 | HR 67 | Temp 98.5°F | Resp 15 | Ht 64.0 in | Wt 191.6 lb

## 2013-09-08 DIAGNOSIS — Z113 Encounter for screening for infections with a predominantly sexual mode of transmission: Secondary | ICD-10-CM

## 2013-09-08 DIAGNOSIS — Z1159 Encounter for screening for other viral diseases: Secondary | ICD-10-CM

## 2013-09-08 NOTE — Progress Notes (Signed)
  Megan [name] - 43 y.o. female MRN [id number]  Date of birth: August 19, 1970  SUBJECTIVE:  Including CC & ROS.  patient C/O: Desire for Hep B testing  Patient and husband report that her husband has hepatitis B he does not know how her became infection but is concerned about his wife. They both also want STI testing but deny any know STI infection. The Patient denies any symptoms, no RUQ pain, no abdominal pain no pelvic pain, no vomiting, no nausea, no diarrhea, no contusion, no skin color changes, no vaginal discharge, no vaginal burning. She also denies any IV drug use or multiple sexual partners.    ROS:  Constitutional:  No fever, chills, or fatigue.  Respiratory:  No shortness of breath, cough, or wheezing Cardiovascular:  No palpitations, chest pain or syncope Gastrointestinal:  No nausea, no abdominal pain Review of systems otherwise negative except for what is stated in HPI  HISTORY: Past Medical, Surgical, Social, and Family History Reviewed & Updated per EMR. Pertinent Historical Findings include: Previous history of irregular vaginal bleeding  PHYSICAL EXAM:  VS: BP:108/66 mmHg  HR:67bpm  TEMP:98.5 F (36.9 C)(Oral)  RESP:98 %  HT:5\' 4"  (162.6 cm)   WT:191 lb 9.6 oz (86.909 kg)  BMI:33 PHYSICAL EXAM: General:  Alert and oriented, No acute distress.   HENT:  Normocephalic, Oral mucosa is moist.  No yellowing of the eyes Respiratory:  Lungs are clear to auscultation, Respirations are non-labored, Symmetrical chest wall expansion.   Cardiovascular:  Normal rate, Regular rhythm, No murmur, Good pulses equal in all extremities, No edema.   Gastrointestinal:  Soft, Non-tender, Non-distended, no rebound tenderness, no guarding, Normal bowel sounds, No organomegaly.   Integumentary:  Warm, Dry, No rash.   Neurologic:  Alert, Oriented, No focal defects Psychiatric:  Cooperative, Appropriate mood & affect.    ASSESSMENT & PLAN:  Give the very unrevealing history and no symptoms I  recommend obtaining STI and Hepatitis panel test. Will f/u with patient about results.  Obtained: GC, Chlamydia, Hepatitis panel, HIV, RPR

## 2013-09-09 LAB — HEPATITIS PANEL, ACUTE
HCV AB: NEGATIVE
HEP B S AG: NEGATIVE
Hep A IgM: NONREACTIVE
Hep B C IgM: NONREACTIVE

## 2013-09-09 LAB — HIV ANTIBODY (ROUTINE TESTING W REFLEX): HIV: NONREACTIVE

## 2013-09-09 LAB — RPR

## 2013-09-09 NOTE — Progress Notes (Signed)
Patient discussed with Dr. Didiano. Agree with assessment and plan of care per her note.   

## 2013-09-10 LAB — GC/CHLAMYDIA PROBE AMP
CT Probe RNA: NEGATIVE
GC Probe RNA: NEGATIVE

## 2014-10-20 ENCOUNTER — Other Ambulatory Visit: Payer: Self-pay | Admitting: Obstetrics and Gynecology

## 2014-10-20 DIAGNOSIS — O10913 Unspecified pre-existing hypertension complicating pregnancy, third trimester: Secondary | ICD-10-CM

## 2015-06-21 ENCOUNTER — Emergency Department (HOSPITAL_COMMUNITY)
Admission: EM | Admit: 2015-06-21 | Discharge: 2015-06-21 | Disposition: A | Discharge status: Home / Self Care | Payer: No Typology Code available for payment source | Attending: Emergency Medicine | Admitting: Emergency Medicine

## 2015-06-21 ENCOUNTER — Encounter (HOSPITAL_COMMUNITY): Payer: Self-pay

## 2015-06-21 DIAGNOSIS — H6123 Impacted cerumen, bilateral: Secondary | ICD-10-CM | POA: Insufficient documentation

## 2015-06-21 MED ORDER — CARBAMIDE PEROXIDE 6.5 % OT SOLN
5.0000 [drp] | Freq: Once | OTIC | Status: AC
  Administered 2015-06-21: 5 [drp] via OTIC
  Filled 2015-06-21: qty 15

## 2015-06-21 NOTE — ED Notes (Signed)
Pt complains of bilateral ear pain for three months

## 2015-06-21 NOTE — ED Provider Notes (Signed)
CSN: [id number]     Arrival date & time 06/21/15  0702 History   First MD Initiated Contact with Patient 06/21/15 [phone]     Chief Complaint  Patient presents with  . Otalgia     (Consider location/radiation/quality/duration/timing/severity/associated sxs/prior Treatment) HPI Comments: Megan [name] is a 45 y.o. female presents to ED with ear fullness and decrease hearing. Symptoms started approximately one year ago in her left ear. She started experiencing right sided ear fullness a few months ago. She has associated decrease hearing, ear itching, and ear discomfort. Additional symptoms include nasal congestion, rhinorrhea, watery eyes, and headache. She denies sore throat. No numbness/weakness, no dizziness or lightheadedness. No fever, chills, or night sweat. She was seen by a provider who told her she had ear wax build up and that she could have a decrease in hearing. Patient does not currently have a PCP.   Patient is a 45 y.o. female presenting with ear pain. The history is provided by the patient.  Otalgia Associated symptoms: congestion, headaches and rhinorrhea   Associated symptoms: no sore throat     Past Medical History  Diagnosis Date  . Allergy   . Pneumonia 2012   Past Surgical History  Procedure Laterality Date  . Cesarean section     History reviewed. No pertinent family history. Social History  Substance Use Topics  . Smoking status: Never Smoker   . Smokeless tobacco: Never Used  . Alcohol Use: No   OB History    Gravida Para Term Preterm AB TAB SAB Ectopic Multiple Living   Review of Systems  HENT: Positive for congestion, ear pain, rhinorrhea, sinus pressure and sneezing. Negative for sore throat and trouble swallowing.   Eyes: Positive for discharge. Eye itching:  watery.  Respiratory: Negative for shortness of breath.   Cardiovascular: Negative for chest pain.  Neurological: Positive for headaches. Negative for dizziness, weakness  and light-headedness.      Allergies  Review of patient's allergies indicates no known allergies.  Home Medications   Prior to Admission medications   Medication Sig Start Date End Date Taking? Authorizing Provider  Multiple Vitamin (MULTIVITAMIN WITH MINERALS) TABS tablet Take 1 tablet by mouth daily.   Yes Historical Provider, MD  dextromethorphan (DELSYM) 30 MG/5ML liquid Take 10 mLs (60 mg total) by mouth as needed for cough. Patient not taking: Reported on 06/21/2015 01/29/12   Clanford Cyndie Mull, MD  ibuprofen (ADVIL,MOTRIN) 600 MG tablet Take 1 tablet (600 mg total) by mouth every 8 (eight) hours as needed for pain or fever (chills). For pain Patient not taking: Reported on 06/21/2015 01/29/12   Clanford Cyndie Mull, MD  loratadine (CLARITIN) 10 MG tablet Take 1 tablet (10 mg total) by mouth daily. Patient not taking: Reported on 06/21/2015 01/29/12   Clanford L Johnson, MD   BP 131/73 mmHg  Pulse 89  Temp(Src) 99.1 F (37.3 C) (Oral)  Resp 20  SpO2 100% Physical Exam  Constitutional: She appears well-developed and well-nourished. No distress.  HENT:  Head: Normocephalic and atraumatic.  Right Ear: External ear normal. No drainage, swelling or tenderness. No mastoid tenderness.  Left Ear: External ear normal. No drainage, swelling or tenderness. No mastoid tenderness.  Nose: Nose normal. Right sinus exhibits no maxillary sinus tenderness and no frontal sinus tenderness. Left sinus exhibits no maxillary sinus tenderness and no frontal sinus tenderness.  Mouth/Throat: Uvula is midline, oropharynx is clear and moist  and mucous membranes are normal. No trismus in the jaw. No uvula swelling. No oropharyngeal exudate, posterior oropharyngeal edema, posterior oropharyngeal erythema or tonsillar abscesses.  Cerumen impaction in both ears b/l, unable to visualize TM.   Eyes: Conjunctivae are normal. Pupils are equal, round, and reactive to light. Right eye exhibits no discharge. Left eye  exhibits no discharge. No scleral icterus.  Neck: Normal range of motion. Neck supple.  Pulmonary/Chest: Effort normal. No respiratory distress.  Lymphadenopathy:    She has no cervical adenopathy.  Neurological: She is alert.  Skin: She is not diaphoretic.  Psychiatric: She has a normal mood and affect. Her behavior is normal.    ED Course  Procedures (including critical care time) Labs Review Labs Reviewed - No data to display  Imaging Review No results found. I have personally reviewed and evaluated these images and lab results as part of my medical decision-making.   EKG Interpretation None      MDM   Final diagnoses:  Cerumen impaction, bilateral    Patient is afebrile and non-toxic appearing. Her vital signs are stable. Physical exam remarkable for cerumen impaction in both ears b/l. No warmth, swelling, or tenderness of mastoid, pinna, or tragus. Doubt otitis externa. Patient is afebrile and no TTP of sinuses - doubt bacterial sinusitis. Suspect ear fullness and decrease hearing is secondary to cerumen impaction.   Ear wax removal attempted with debrox and manual removal. Minimal ear wax removal achieved. However, patient endorses a subjective improvement in hearing and fullness. Discussed use of ear wave removal drops for next three days. Encouraged symptomatic treatment of nasal congestion, rhinorrhea, and sneezing. Encouraged establishment of PCP and provided contact information. Discussed return precautions. Patient voiced understanding and is agreeable.     Lona Kettleshley Laurel Meyer, PA-C 06/21/15 1125  Mancel BaleElliott Wentz, MD 06/22/15 2046

## 2015-06-21 NOTE — Discharge Instructions (Signed)
Read the information below.  Use the prescribed medication as directed.  Please discuss all new medications with your pharmacist.   You are given an ear wax removal drop. Place 5 drops in your ears twice daily. Use drops only for the next three days. This should help break up the ear wax in your ears.  For nasal congestion, I encourage you to take Allegra, Claritin, or Zyrtec - this are allergy medications over the counter. They will help with the water eyes, sneezing, nasal congestion.  I have provided the contact information for [facility]. It is important that you establish a PCP for further follow up and evaluation.  You may return to the Emergency Department at any time for worsening condition or any new symptoms that concern you. Return to ED if your symptoms worsen, you develop a fever, dizziness, or loss of consciousness.    Cerumen Impaction The structures of the external ear canal secrete a waxy substance known as cerumen. Excess cerumen can build up in the ear canal, causing a condition known as cerumen impaction. Cerumen impaction can cause ear pain and disrupt the function of the ear. The rate of cerumen production differs for each individual. In certain individuals, the configuration of the ear canal may decrease his or her ability to naturally remove cerumen. CAUSES Cerumen impaction is caused by excessive cerumen production or buildup. RISK FACTORS  Frequent use of swabs to clean ears.  Having narrow ear canals.  Having eczema.  Being dehydrated. SIGNS AND SYMPTOMS  Diminished hearing.  Ear drainage.  Ear pain.  Ear itch. TREATMENT Treatment may involve:  Over-the-counter or prescription ear drops to soften the cerumen.  Removal of cerumen by a health care provider. This may be done with:  Irrigation with warm water. This is the most common method of removal.  Ear curettes and other instruments.  Surgery. This may be done in severe cases. HOME  CARE INSTRUCTIONS  Take medicines only as directed by your health care provider.  Do not insert objects into the ear with the intent of cleaning the ear. PREVENTION  Do not insert objects into the ear, even with the intent of cleaning the ear. Removing cerumen as a part of normal hygiene is not necessary, and the use of swabs in the ear canal is not recommended.  Drink enough water to keep your urine clear or pale yellow.  Control your eczema if you have it. SEEK MEDICAL CARE IF:  You develop ear pain.  You develop bleeding from the ear.  The cerumen does not clear after you use ear drops as directed.   This information is not intended to replace advice given to you by your health care provider. Make sure you discuss any questions you have with your health care provider.   Document Released: 02/01/2004 Document Revised: 01/14/2014 Document Reviewed: 08/10/2014 Elsevier Interactive Patient Education [employer]2016 [employer]vier Inc.

## 2017-06-11 ENCOUNTER — Encounter (HOSPITAL_COMMUNITY): Payer: Self-pay | Admitting: Emergency Medicine

## 2017-06-11 ENCOUNTER — Emergency Department (HOSPITAL_COMMUNITY)
Admission: EM | Admit: 2017-06-11 | Discharge: 2017-06-11 | Disposition: A | Discharge status: Home / Self Care | Payer: No Typology Code available for payment source | Attending: Emergency Medicine | Admitting: Emergency Medicine

## 2017-06-11 ENCOUNTER — Emergency Department (HOSPITAL_COMMUNITY): Payer: No Typology Code available for payment source

## 2017-06-11 DIAGNOSIS — M25512 Pain in left shoulder: Secondary | ICD-10-CM | POA: Insufficient documentation

## 2017-06-11 DIAGNOSIS — R079 Chest pain, unspecified: Secondary | ICD-10-CM | POA: Insufficient documentation

## 2017-06-11 LAB — CBC WITH DIFFERENTIAL/PLATELET
BASOS PCT: 1 %
Basophils Absolute: 0.1 10*3/uL (ref 0.0–0.1)
EOS ABS: 0.5 10*3/uL (ref 0.0–0.7)
EOS PCT: 9 %
HCT: 37.7 % (ref 36.0–46.0)
HEMOGLOBIN: 11.8 g/dL — AB (ref 12.0–15.0)
Lymphocytes Relative: 36 %
Lymphs Abs: 2 10*3/uL (ref 0.7–4.0)
MCH: 25.7 pg — ABNORMAL LOW (ref 26.0–34.0)
MCHC: 31.3 g/dL (ref 30.0–36.0)
MCV: 82.1 fL (ref 78.0–100.0)
Monocytes Absolute: 0.3 10*3/uL (ref 0.1–1.0)
Monocytes Relative: 6 %
NEUTROS PCT: 48 %
Neutro Abs: 2.7 10*3/uL (ref 1.7–7.7)
PLATELETS: 240 10*3/uL (ref 150–400)
RBC: 4.59 MIL/uL (ref 3.87–5.11)
RDW: 14.7 % (ref 11.5–15.5)
WBC: 5.6 10*3/uL (ref 4.0–10.5)

## 2017-06-11 LAB — BASIC METABOLIC PANEL
Anion gap: 3 — ABNORMAL LOW (ref 5–15)
BUN: 13 mg/dL (ref 6–20)
CHLORIDE: 109 mmol/L (ref 101–111)
CO2: 29 mmol/L (ref 22–32)
CREATININE: 0.81 mg/dL (ref 0.44–1.00)
Calcium: 9.3 mg/dL (ref 8.9–10.3)
Glucose, Bld: 107 mg/dL — ABNORMAL HIGH (ref 65–99)
POTASSIUM: 4 mmol/L (ref 3.5–5.1)
Sodium: 141 mmol/L (ref 135–145)

## 2017-06-11 LAB — I-STAT TROPONIN, ED: TROPONIN I, POC: 0 ng/mL (ref 0.00–0.08)

## 2017-06-11 LAB — D-DIMER, QUANTITATIVE: D-Dimer, Quant: 0.45 ug/mL-FEU (ref 0.00–0.50)

## 2017-06-11 LAB — POC URINE PREG, ED: Preg Test, Ur: NEGATIVE

## 2017-06-11 MED ORDER — CYCLOBENZAPRINE HCL 10 MG PO TABS: 10.0000 mg | ORAL_TABLET | Freq: Two times a day (BID) | ORAL | 0 refills | Status: AC | PRN

## 2017-06-11 NOTE — ED Triage Notes (Signed)
Patient here from home via EMS with complaints of left shoulder pain. Increased with movement. Denies trauma. Hx of same two weeks ago.

## 2017-06-11 NOTE — ED Provider Notes (Signed)
[facility]   CSN: [id number] Arrival date & time: 06/11/17  0744     History   Chief Complaint Chief Complaint  Patient presents with  . Shoulder Pain    HPI Megan [name] is a 47 y.o. female.  The history is provided by the patient and medical records. No language interpreter was used.  Shoulder Pain   This is a new problem. The current episode started 12 to 24 hours ago. The problem occurs constantly. The problem has not changed since onset.The pain is present in the left arm. The quality of the pain is described as sharp and constant. The pain is at a severity of 8/10. The pain is moderate. Associated symptoms include tingling. Pertinent negatives include no numbness, full range of motion, no stiffness and no itching. The symptoms are aggravated by contact. She has tried nothing for the symptoms. The treatment provided no relief. There has been no history of extremity trauma.    Past Medical History:  Diagnosis Date  . Allergy   . Pneumonia 2012    Patient Active Problem List   Diagnosis Date Noted  . Missed abortion 04/03/2011  . IRREGULAR MENSTRUATION 07/29/2006  . BACK PAIN 07/29/2006  . PELVIC  PAIN 07/29/2006  . FIBROADENOSIS, BREAST 03/06/2006    Past Surgical History:  Procedure Laterality Date  . CESAREAN SECTION       OB History    Gravida  6   Para  4   Term  4   Preterm      AB  1   Living  4     SAB  1   TAB      Ectopic      Multiple      Live Births               Home Medications    Prior to Admission medications   Medication Sig Start Date End Date Taking? Authorizing Provider  dextromethorphan (DELSYM) 30 MG/5ML liquid Take 10 mLs (60 mg total) by mouth as needed for cough. Patient not taking: Reported on 06/21/2015 01/29/12   Cleora Fleet, MD  ibuprofen (ADVIL,MOTRIN) 600 MG tablet Take 1 tablet (600 mg total) by mouth every 8 (eight) hours as needed for pain or  fever (chills). For pain Patient not taking: Reported on 06/21/2015 01/29/12   Cleora Fleet, MD  loratadine (CLARITIN) 10 MG tablet Take 1 tablet (10 mg total) by mouth daily. Patient not taking: Reported on 06/21/2015 01/29/12   Cleora Fleet, MD  Multiple Vitamin (MULTIVITAMIN WITH MINERALS) TABS tablet Take 1 tablet by mouth daily.    [provider]    Family History History reviewed. No pertinent family history.  Social History Social History   Tobacco Use  . Smoking status: Never Smoker  . Smokeless tobacco: Never Used  Substance Use Topics  . Alcohol use: No  . Drug use: No     Allergies   Patient has no known allergies.   Review of Systems Review of Systems  Constitutional: Negative for chills, fatigue and fever.  HENT: Negative for congestion.   Eyes: Negative for visual disturbance.  Respiratory: Positive for cough, chest tightness and shortness of breath. Negative for wheezing and stridor.   Cardiovascular: Positive for chest pain. Negative for palpitations.  Gastrointestinal: Negative for abdominal pain, diarrhea, nausea and vomiting.  Genitourinary: Negative for dysuria and flank pain.  Musculoskeletal: Negative for back pain, neck pain, neck stiffness  and stiffness.  Skin: Negative for itching.  Neurological: Positive for tingling. Negative for dizziness, weakness, numbness and headaches.  Psychiatric/Behavioral: Negative for agitation and confusion.  All other systems reviewed and are negative.    Physical Exam Updated Vital Signs BP 138/83 (BP Location: Right Arm)   Pulse 61   Temp 98.2 F (36.8 C) (Oral)   Resp 16   SpO2 100%   Physical Exam  Constitutional: She appears well-developed and well-nourished. No distress.  HENT:  Head: Normocephalic and atraumatic.  Nose: Nose normal.  Mouth/Throat: Oropharynx is clear and moist. No oropharyngeal exudate.  Eyes: Pupils are equal, round, and reactive to light. Conjunctivae and EOM  are normal.  Neck: Normal range of motion. Neck supple.  Cardiovascular: Normal rate, regular rhythm and intact distal pulses.  No murmur heard. Pulmonary/Chest: Effort normal and breath sounds normal. No stridor. No respiratory distress. She has no wheezes.     She exhibits tenderness.    Abdominal: Soft. There is no tenderness.  Musculoskeletal: She exhibits tenderness. She exhibits no edema or deformity.       Left shoulder: She exhibits tenderness and pain. She exhibits normal range of motion and no deformity.       Arms: Neurological: She is alert.  Skin: Skin is warm and dry. Capillary refill takes less than 2 seconds. No rash noted. She is not diaphoretic. No erythema.  Psychiatric: She has a normal mood and affect.  Nursing note and vitals reviewed.    ED Treatments / Results  Labs (all labs ordered are listed, but only abnormal results are displayed) Labs Reviewed  CBC WITH DIFFERENTIAL/PLATELET - Abnormal; Notable for the following components:      Result Value   Hemoglobin 11.8 (*)    MCH 25.7 (*)    All other components within normal limits  BASIC METABOLIC PANEL - Abnormal; Notable for the following components:   Glucose, Bld 107 (*)    Anion gap 3 (*)    All other components within normal limits  D-DIMER, QUANTITATIVE (NOT AT [facility])  I-STAT TROPONIN, ED  POC URINE PREG, ED  I-STAT TROPONIN, ED    EKG EKG Interpretation  Date/Time:  Wednesday June 11 2017 08:35:27 EDT Ventricular Rate:  60 PR Interval:    QRS Duration: 103 QT Interval:  394 QTC Calculation: 394 R Axis:   58 Text Interpretation:  Sinus rhythm Borderline prolonged PR interval When compared to prior, no significant changes seen.  No STEMI Confirmed by Theda Belfastegeler, Chris ([id number]) on 06/11/2017 9:15:16 AM   Radiology Dg Chest 2 View  Result Date: 06/11/2017 CLINICAL DATA:  Chest/left shoulder pain, cough EXAM: CHEST - 2 VIEW COMPARISON:  06/17/2010 FINDINGS: Lungs are clear.  No pleural  effusion or pneumothorax. The heart is normal in size. Visualized osseous structures are within normal limits. IMPRESSION: Normal chest radiographs. Electronically Signed   By: Charline BillsSriyesh  Krishnan M.D.   On: 06/11/2017 09:27   Dg Shoulder Left  Result Date: 06/11/2017 CLINICAL DATA:  Chest/left shoulder pain, cough EXAM: LEFT SHOULDER - 2+ VIEW COMPARISON:  None. FINDINGS: No fracture or dislocation is seen. The joint spaces are preserved. Visualized soft tissues are within normal limits. Visualized left lung is clear IMPRESSION: Negative. Electronically Signed   By: Charline BillsSriyesh  Krishnan M.D.   On: 06/11/2017 09:28    Procedures Procedures (including critical care time)  Medications Ordered in ED Medications - No data to display   Initial Impression / Assessment and Plan / ED Course  I  have reviewed the triage vital signs and the nursing notes.  Pertinent labs & imaging results that were available during my care of the patient were reviewed by me and considered in my medical decision making (see chart for details).     Megan [name] is a 47 y.o. female with no significant past medical history who presents with left-sided chest pain and left shoulder pain.  Patient reports that she has had no direct trauma preceding her symptoms.  She reports that she has a sharp and tightness pain in her left upper and left lateral chest as well as her left shoulder and upper left arm.  She reports that it is very pleuritic and worse with deep breathing.  She reports that it is somewhat worse when she tries to move her shoulder.  She denies any numbness but reported mild tingling.  She denied nausea vomiting, diaphoresis.  She denies smoking and no family history of heart disease.  She describes her pain as it a 10 severity.  She denies history of DVT or PE.  She reports that last week she was having some intermittent pain in her left shoulder but resolved.  She reports that she did some lifting a few days ago moving  a mattress but denied pain at that time.  She does report recent cough that had a mucus-like production.  She denies fevers or chills.  Patient is primarily wanting to rule out a cardiac or lung cause of her shoulder pain.  On exam, patient had some palpable tenderness in her left lateral chest in the axilla as well as her left shoulder.  Patient had symmetric radial pulses.  Patient had normal sensation grip strength bilaterally.  Patient had clear lungs.  No rhonchi or crackles.  Abdomen nontender.  Patient otherwise appears well.  No extremity edema seen.  Patient will have laboratory testing chest x-ray, shoulder x-ray, and EKG.  Patient will have some laboratory testing.    Suspect musculoskeletal disease however will rule out cardiac and pulmonary cause of the pain.  Anticipate discharge if work-up is reassuring.  Heart score calculated as a 0.  12:13 PM Troponin was negative.  D-dimer was negative.  CBC and BMP reassuring.  Chest x-ray showed no pneumonia.  X-ray of the shoulder reassuring.  Fracture or dislocation.  Suspect musculoskeletal pain given her reassuring work-up.  I suspect that she may have injured it when she was coughing and then worsened when she was lifting the mattress several days ago.  Patient will follow-up with a PCP and understood return precautions.  Patient understood the plan of care and had no other questions or concerns.  Patient discharged in good condition.   Final Clinical Impressions(s) / ED Diagnoses   Final diagnoses:  Acute pain of left shoulder    ED Discharge Orders        Ordered    cyclobenzaprine (FLEXERIL) 10 MG tablet  2 times daily PRN     06/11/17 1212      Clinical Impression: 1. Acute pain of left shoulder     Disposition: Discharge  Condition: Good  I have discussed the results, Dx and Tx plan with the pt(& family if present). He/she/they expressed understanding and agree(s) with the plan. Discharge instructions discussed at  great length. Strict return precautions discussed and pt &/or family have verbalized understanding of the instructions. No further questions at time of discharge.    New Prescriptions   CYCLOBENZAPRINE (FLEXERIL) 10 MG TABLET    Take  1 tablet (10 mg total) by mouth 2 (two) times daily as needed for muscle spasms.    Follow Up: [facility] [address] [place] [address] [phone] Schedule an appointment as soon as possible for a visit    [facility] [address] [id number] mc [place] [address] [phone]       Millee Denise, Canary Brim, MD 06/11/17 1214

## 2017-06-11 NOTE — Discharge Instructions (Signed)
Your work-up today did not show evidence of acute traumatic injuries and we do not see evidence of a heart or lung cause of your symptoms.  We suspect soft tissue/muscular skeletal pains which may have been worsened with the coughing and mattress lifting.  Please take the muscle relaxant to help with your symptoms and follow-up with your primary doctor in several days.  If any symptoms change or worsen, please return to the nearest emergency department.

## 2018-04-10 ENCOUNTER — Encounter (HOSPITAL_COMMUNITY): Payer: Self-pay

## 2018-04-10 ENCOUNTER — Other Ambulatory Visit: Payer: Self-pay

## 2018-04-10 ENCOUNTER — Emergency Department (HOSPITAL_COMMUNITY)
Admission: EM | Admit: 2018-04-10 | Discharge: 2018-04-10 | Disposition: A | Discharge status: Home / Self Care | Payer: No Typology Code available for payment source | Attending: Emergency Medicine | Admitting: Emergency Medicine

## 2018-04-10 DIAGNOSIS — J9801 Acute bronchospasm: Secondary | ICD-10-CM | POA: Insufficient documentation

## 2018-04-10 DIAGNOSIS — Z79899 Other long term (current) drug therapy: Secondary | ICD-10-CM | POA: Insufficient documentation

## 2018-04-10 MED ORDER — ALBUTEROL SULFATE HFA 108 (90 BASE) MCG/ACT IN AERS
2.0000 | INHALATION_SPRAY | RESPIRATORY_TRACT | Status: DC | PRN
  Administered 2018-04-10: 2 via RESPIRATORY_TRACT
  Filled 2018-04-10: qty 6.7

## 2018-04-10 NOTE — ED Notes (Addendum)
After using inhaler, lung sounds were clear bilaterally. MD aware and auscultated himself. Pt aware she should still monitor symptoms at home and take proper precautions

## 2018-04-10 NOTE — Discharge Instructions (Addendum)
Person Under Monitoring Name: Megan [name]  Location: [address] [place] [address]   Infection Prevention Recommendations for Individuals Confirmed to have, or Being Evaluated for, 2019 Novel Coronavirus (COVID-19) Infection Who Receive Care at Home  Individuals who are confirmed to have, or are being evaluated for, COVID-19 should follow the prevention steps below until a healthcare provider or local or state health department says they can return to normal activities.  Stay home except to get medical care You should restrict activities outside your home, except for getting medical care. Do not go to work, school, or public areas, and do not use public transportation or taxis.  Call ahead before visiting your doctor Before your medical appointment, call the healthcare provider and tell them that you have, or are being evaluated for, COVID-19 infection. This will help the healthcare providers office take steps to keep other people from getting infected. Ask your healthcare provider to call the local or state health department.  Monitor your symptoms Seek prompt medical attention if your illness is worsening (e.g., difficulty breathing). Before going to your medical appointment, call the healthcare provider and tell them that you have, or are being evaluated for, COVID-19 infection. Ask your healthcare provider to call the local or state health department.  Wear a facemask You should wear a facemask that covers your nose and mouth when you are in the same room with other people and when you visit a healthcare provider. People who live with or visit you should also wear a facemask while they are in the same room with you.  Separate yourself from other people in your home As much as possible, you should stay in a different room from other people in your home. Also, you should use a separate bathroom, if available.  Avoid sharing household items You  should not share dishes, drinking glasses, cups, eating utensils, towels, bedding, or other items with other people in your home. After using these items, you should wash them thoroughly with soap and water.  Cover your coughs and sneezes Cover your mouth and nose with a tissue when you cough or sneeze, or you can cough or sneeze into your sleeve. Throw used tissues in a lined trash can, and immediately wash your hands with soap and water for at least 20 seconds or use an alcohol-based hand rub.  Wash your [employer] your hands often and thoroughly with soap and water for at least 20 seconds. You can use an alcohol-based hand sanitizer if soap and water are not available and if your hands are not visibly dirty. Avoid touching your eyes, nose, and mouth with unwashed hands.   Prevention Steps for Caregivers and Household Members of Individuals Confirmed to have, or Being Evaluated for, COVID-19 Infection Being Cared for in the Home  If you live with, or provide care at home for, a person confirmed to have, or being evaluated for, COVID-19 infection please follow these guidelines to prevent infection:  Follow healthcare providers instructions Make sure that you understand and can help the patient follow any healthcare provider instructions for all care.  Provide for the patients basic needs You should help the patient with basic needs in the home and provide support for getting groceries, prescriptions, and other personal needs.  Monitor the patients symptoms If they are getting sicker, call his or her medical provider and tell them that the patient has, or is being evaluated for, COVID-19 infection. This will help the healthcare  providers office take steps to keep other people from getting infected. Ask the healthcare provider to call the local or state health department.  Limit the number of people who have contact with the patient If possible, have only one caregiver for the  patient. Other household members should stay in another home or place of residence. If this is not possible, they should stay in another room, or be separated from the patient as much as possible. Use a separate bathroom, if available. Restrict visitors who do not have an essential need to be in the home.  Keep older adults, very young children, and other sick people away from the patient Keep older adults, very young children, and those who have compromised immune systems or chronic health conditions away from the patient. This includes people with chronic heart, lung, or kidney conditions, diabetes, and cancer.  Ensure good ventilation Make sure that shared spaces in the home have good air flow, such as from an air conditioner or an opened window, weather permitting.  Wash your hands often Wash your hands often and thoroughly with soap and water for at least 20 seconds. You can use an alcohol based hand sanitizer if soap and water are not available and if your hands are not visibly dirty. Avoid touching your eyes, nose, and mouth with unwashed hands. Use disposable paper towels to dry your hands. If not available, use dedicated cloth towels and replace them when they become wet.  Wear a facemask and gloves Wear a disposable facemask at all times in the room and gloves when you touch or have contact with the patients blood, body fluids, and/or secretions or excretions, such as sweat, saliva, sputum, nasal mucus, vomit, urine, or feces.  Ensure the mask fits over your nose and mouth tightly, and do not touch it during use. Throw out disposable facemasks and gloves after using them. Do not reuse. Wash your hands immediately after removing your facemask and gloves. If your personal clothing becomes contaminated, carefully remove clothing and launder. Wash your hands after handling contaminated clothing. Place all used disposable facemasks, gloves, and other waste in a lined container before  disposing them with other household waste. Remove gloves and wash your hands immediately after handling these items.  Do not share dishes, glasses, or other household items with the patient Avoid sharing household items. You should not share dishes, drinking glasses, cups, eating utensils, towels, bedding, or other items with a patient who is confirmed to have, or being evaluated for, COVID-19 infection. After the person uses these items, you should wash them thoroughly with soap and water.  Wash laundry thoroughly Immediately remove and wash clothes or bedding that have blood, body fluids, and/or secretions or excretions, such as sweat, saliva, sputum, nasal mucus, vomit, urine, or feces, on them. Wear gloves when handling laundry from the patient. Read and follow directions on labels of laundry or clothing items and detergent. In general, wash and dry with the warmest temperatures recommended on the label.  Clean all areas the individual has used often Clean all touchable surfaces, such as counters, tabletops, doorknobs, bathroom fixtures, toilets, phones, keyboards, tablets, and bedside tables, every day. Also, clean any surfaces that may have blood, body fluids, and/or secretions or excretions on them. Wear gloves when cleaning surfaces the patient has come in contact with. Use a diluted bleach solution (e.g., dilute bleach with 1 part bleach and 10 parts water) or a household disinfectant with a label that says EPA-registered for coronaviruses. To make  a bleach solution at home, add 1 tablespoon of bleach to 1 quart (4 cups) of water. For a larger supply, add  cup of bleach to 1 gallon (16 cups) of water. Read labels of cleaning products and follow recommendations provided on product labels. Labels contain instructions for safe and effective use of the cleaning product including precautions you should take when applying the product, such as wearing gloves or eye protection and making sure you  have good ventilation during use of the product. Remove gloves and wash hands immediately after cleaning.  Monitor yourself for signs and symptoms of illness Caregivers and household members are considered close contacts, should monitor their health, and will be asked to limit movement outside of the home to the extent possible. Follow the monitoring steps for close contacts listed on the symptom monitoring form.   ? If you have additional questions, contact your local health department or call the epidemiologist on call at [phone] (available 24/7). ? This guidance is subject to change. For the most up-to-date guidance from [facility], please refer to their website: [email or web address]

## 2018-04-10 NOTE — ED Notes (Signed)
Pt given discharge paperwork and verbalized understanding of follow up care and medications. Pt A+OX4 and ambulatory.

## 2018-04-10 NOTE — ED Triage Notes (Addendum)
Pt reports cough and SOB starting today. Endorses chest soreness when coughing or deep breathing. Some wheezing noted. Denies travel or contact with anyone sick. She endorses low grade fever.

## 2018-04-10 NOTE — ED Provider Notes (Signed)
[facility]-EMERGENCY DEPT Provider Note: Lowella Dell, MD, FACEP  CSN: [id number] MRN: [id number] ARRIVAL: 04/10/18 at 0052 ROOM: WA12/WA12   CHIEF COMPLAINT  Shortness of Breath   HISTORY OF PRESENT ILLNESS  04/10/18 1:03 AM Megan [name] is a 48 y.o. female with a history of allergies.  She states she cleaned her house yesterday stirring up a lot of dust.  She was also using Lysol spray.  After doing this she developed shortness of breath and wheezing.  She is also having some chest soreness when coughing or deep breathing.  She denies a fever but was noted to have a temperature of 99 1 on arrival.  She denies recent travel or sick contacts.  Symptoms are moderate.  She does not have an albuterol inhaler.    Past Medical History:  Diagnosis Date  . Allergy   . Pneumonia 2012    Past Surgical History:  Procedure Laterality Date  . CESAREAN SECTION      History reviewed. No pertinent family history.  Social History   Tobacco Use  . Smoking status: Never Smoker  . Smokeless tobacco: Never Used  Substance Use Topics  . Alcohol use: No  . Drug use: No    Prior to Admission medications   Medication Sig Start Date End Date Taking? Authorizing Provider  cyclobenzaprine (FLEXERIL) 10 MG tablet Take 1 tablet (10 mg total) by mouth 2 (two) times daily as needed for muscle spasms. 06/11/17   Tegeler, Canary Brim, MD  dextromethorphan (DELSYM) 30 MG/5ML liquid Take 10 mLs (60 mg total) by mouth as needed for cough. 01/29/12   Johnson, Clanford L, MD  guaiFENesin (MUCINEX) 600 MG 12 hr tablet Take 1,200 mg by mouth 2 (two) times daily as needed for cough.    [provider]  ibuprofen (ADVIL,MOTRIN) 600 MG tablet Take 1 tablet (600 mg total) by mouth every 8 (eight) hours as needed for pain or fever (chills). For pain Patient not taking: Reported on 06/21/2015 01/29/12   Cleora Fleet, MD  ketotifen (ZADITOR) 0.025 % ophthalmic solution Place 2 drops into both eyes 2 (two)  times daily as needed (allergies).    [provider]  loratadine (CLARITIN) 10 MG tablet Take 1 tablet (10 mg total) by mouth daily. Patient not taking: Reported on 06/21/2015 01/29/12   Cleora Fleet, MD  Multiple Vitamin (MULTIVITAMIN WITH MINERALS) TABS tablet Take 1 tablet by mouth daily.    [provider]    Allergies Patient has no known allergies.   REVIEW OF SYSTEMS  Negative except as noted here or in the History of Present Illness.   PHYSICAL EXAMINATION  Initial Vital Signs Blood pressure (!) 142/100, pulse 84, temperature 99.1 F (37.3 C), temperature source Oral, resp. rate 18, height 5\' 6"  (1.676 m), weight 89.8 kg, SpO2 98 %.  Examination General: Well-developed, well-nourished female in no acute distress; appearance consistent with age of record HENT: normocephalic; atraumatic Eyes: Normal appearance Neck: supple Heart: regular rate and rhythm Lungs: Expiratory wheezes Abdomen: soft; nondistended; nontender; bowel sounds present Extremities: No deformity; full range of motion Neurologic: Awake, alert and oriented; motor function intact in all extremities and symmetric; no facial droop Skin: Warm and dry Psychiatric: Normal mood and affect   RESULTS  Summary of this visit's results, reviewed by myself:   EKG Interpretation  Date/Time:    Ventricular Rate:    PR Interval:    QRS Duration:   QT Interval:    QTC Calculation:  R Axis:     Text Interpretation:        Laboratory Studies: No results found for this or any previous visit (from the past 24 hour(s)). Imaging Studies: No results found.  ED COURSE and MDM  Nursing notes and initial vitals signs, including pulse oximetry, reviewed.  Vitals:   04/10/18 0059  BP: (!) 142/100  Pulse: 84  Resp: 18  Temp: 99.1 F (37.3 C)  TempSrc: Oral  SpO2: 98%  Weight: 89.8 kg  Height: 5\' 6"  (1.676 m)   Megan [name] was evaluated in Emergency Department on 04/10/2018 for  the symptoms described in the history of present illness. She was evaluated in the context of the global COVID-19 pandemic, which necessitated consideration that the patient might be at risk for infection with the SARS-CoV-2 virus that causes COVID-19. Institutional protocols and algorithms that pertain to the evaluation of patients at risk for COVID-19 are in a state of rapid change based on information released by regulatory bodies including the CDC and federal and state organizations. These policies and algorithms were followed during the patient's care in the ED.  1:35 AM Lungs clear after albuterol inhaler treatment.  She was given an inhaler and instructed in its use.  This likely represents an allergic reaction to stirred up dust but given her low-grade fever we will place her on COVID-19 quarantine.  PROCEDURES    ED DIAGNOSES     ICD-10-CM   1. Acute bronchospasm J98.01        Lakethia Coppess, Jonny Ruiz, MD 04/10/18 [phone]

## 2018-07-28 ENCOUNTER — Emergency Department (HOSPITAL_COMMUNITY)
Admission: EM | Admit: 2018-07-28 | Discharge: 2018-07-28 | Disposition: A | Discharge status: Home / Self Care | Payer: No Typology Code available for payment source | Attending: Emergency Medicine | Admitting: Emergency Medicine

## 2018-07-28 ENCOUNTER — Encounter (HOSPITAL_COMMUNITY): Payer: Self-pay

## 2018-07-28 ENCOUNTER — Other Ambulatory Visit: Payer: Self-pay

## 2018-07-28 DIAGNOSIS — Z79899 Other long term (current) drug therapy: Secondary | ICD-10-CM | POA: Insufficient documentation

## 2018-07-28 DIAGNOSIS — R102 Pelvic and perineal pain: Secondary | ICD-10-CM | POA: Insufficient documentation

## 2018-07-28 DIAGNOSIS — R350 Frequency of micturition: Secondary | ICD-10-CM | POA: Insufficient documentation

## 2018-07-28 DIAGNOSIS — R1032 Left lower quadrant pain: Secondary | ICD-10-CM | POA: Insufficient documentation

## 2018-07-28 DIAGNOSIS — R1031 Right lower quadrant pain: Secondary | ICD-10-CM | POA: Insufficient documentation

## 2018-07-28 DIAGNOSIS — N841 Polyp of cervix uteri: Secondary | ICD-10-CM | POA: Insufficient documentation

## 2018-07-28 LAB — CBC WITH DIFFERENTIAL/PLATELET
Abs Immature Granulocytes: 0.01 10*3/uL (ref 0.00–0.07)
Basophils Absolute: 0.1 10*3/uL (ref 0.0–0.1)
Basophils Relative: 1 %
Eosinophils Absolute: 0.4 10*3/uL (ref 0.0–0.5)
Eosinophils Relative: 7 %
HCT: 33.5 % — ABNORMAL LOW (ref 36.0–46.0)
Hemoglobin: 10 g/dL — ABNORMAL LOW (ref 12.0–15.0)
Immature Granulocytes: 0 %
Lymphocytes Relative: 38 %
Lymphs Abs: 2.2 10*3/uL (ref 0.7–4.0)
MCH: 25 pg — ABNORMAL LOW (ref 26.0–34.0)
MCHC: 29.9 g/dL — ABNORMAL LOW (ref 30.0–36.0)
MCV: 83.8 fL (ref 80.0–100.0)
Monocytes Absolute: 0.5 10*3/uL (ref 0.1–1.0)
Monocytes Relative: 8 %
Neutro Abs: 2.6 10*3/uL (ref 1.7–7.7)
Neutrophils Relative %: 46 %
Platelets: 233 10*3/uL (ref 150–400)
RBC: 4 MIL/uL (ref 3.87–5.11)
RDW: 14.5 % (ref 11.5–15.5)
WBC: 5.7 10*3/uL (ref 4.0–10.5)
nRBC: 0 % (ref 0.0–0.2)

## 2018-07-28 LAB — URINALYSIS, ROUTINE W REFLEX MICROSCOPIC
Bacteria, UA: NONE SEEN
Bilirubin Urine: NEGATIVE
Glucose, UA: NEGATIVE mg/dL
Ketones, ur: NEGATIVE mg/dL
Nitrite: NEGATIVE
Protein, ur: NEGATIVE mg/dL
Specific Gravity, Urine: 1.001 — ABNORMAL LOW (ref 1.005–1.030)
pH: 6 (ref 5.0–8.0)

## 2018-07-28 LAB — HIV ANTIBODY (ROUTINE TESTING W REFLEX): HIV Screen 4th Generation wRfx: NONREACTIVE

## 2018-07-28 LAB — BASIC METABOLIC PANEL
Anion gap: 7 (ref 5–15)
BUN: 17 mg/dL (ref 6–20)
CO2: 24 mmol/L (ref 22–32)
Calcium: 8.9 mg/dL (ref 8.9–10.3)
Chloride: 106 mmol/L (ref 98–111)
Creatinine, Ser: 0.83 mg/dL (ref 0.44–1.00)
GFR calc Af Amer: >60 mL/min (ref >60)
GFR calc non Af Amer: >60 mL/min (ref >60)
Glucose, Bld: 99 mg/dL (ref 70–99)
Potassium: 3.9 mmol/L (ref 3.5–5.1)
Sodium: 137 mmol/L (ref 135–145)

## 2018-07-28 LAB — WET PREP, GENITAL
Clue Cells Wet Prep HPF POC: NONE SEEN
Sperm: NONE SEEN
Trich, Wet Prep: NONE SEEN
Yeast Wet Prep HPF POC: NONE SEEN

## 2018-07-28 LAB — I-STAT BETA HCG BLOOD, ED (MC, WL, AP ONLY): I-stat hCG, quantitative: <5 m[IU]/mL (ref <5)

## 2018-07-28 LAB — RPR: RPR Ser Ql: NONREACTIVE

## 2018-07-28 NOTE — Discharge Instructions (Addendum)
Get help right away if:  You have sudden severe pain.  Your pain gets steadily worse.  You have severe pain along with fever, nausea, vomiting, or excessive sweating.  You lose consciousness.

## 2018-07-28 NOTE — ED Provider Notes (Signed)
[facility] DEPT Provider Note   CSN: [id number] Arrival date & time: 07/28/18  0736    History   Chief Complaint Chief Complaint  Patient presents with  . Abdominal Pain  . Vaginal Pain    HPI Megan [name] is a 48 y.o. female who presents emergency department chief complaint of pelvic pain.  She states that she has had about 1 week of pain in her pelvis radiating into her bilateral flanks.  She has had some increased frequency and urgency of urination.  She thinks however that this is due to her ParaGard IUD which is also having heavy bleeding with her periods.  The patient is married and sexually active with a single female partner.  She denies pain with intercourse.  She has had some thin, clear discharge which is not malodorous.  She denies fevers, chills.     HPI  Past Medical History:  Diagnosis Date  . Allergy   . Pneumonia 2012    Patient Active Problem List   Diagnosis Date Noted  . Missed abortion 04/03/2011  . IRREGULAR MENSTRUATION 07/29/2006  . BACK PAIN 07/29/2006  . PELVIC  PAIN 07/29/2006  . FIBROADENOSIS, BREAST 03/06/2006    Past Surgical History:  Procedure Laterality Date  . CESAREAN SECTION       OB History    Gravida  6   Para  4   Term  4   Preterm      AB  1   Living  4     SAB  1   TAB      Ectopic      Multiple      Live Births               Home Medications    Prior to Admission medications   Medication Sig Start Date End Date Taking? Authorizing Provider  acetaminophen (TYLENOL) 500 MG tablet Take 500 mg by mouth every 6 (six) hours as needed for mild pain.   Yes [provider]  albuterol (VENTOLIN HFA) 108 (90 Base) MCG/ACT inhaler Inhale 2 puffs into the lungs every 6 (six) hours as needed for wheezing or shortness of breath.   Yes [provider]  Multiple Vitamin (MULTIVITAMIN WITH MINERALS) TABS tablet Take 1 tablet by mouth daily.   Yes [provider]  PARAGARD INTRAUTERINE COPPER IU 1 Device by Intrauterine route once.   Yes [provider]  cyclobenzaprine (FLEXERIL) 10 MG tablet Take 1 tablet (10 mg total) by mouth 2 (two) times daily as needed for muscle spasms. Patient not taking: Reported on 07/28/2018 06/11/17   Tegeler, Gwenyth Allegra, MD  dextromethorphan (DELSYM) 30 MG/5ML liquid Take 10 mLs (60 mg total) by mouth as needed for cough. Patient not taking: Reported on 07/28/2018 01/29/12   Murlean Iba, MD    Family History History reviewed. No pertinent family history.  Social History Social History   Tobacco Use  . Smoking status: Never Smoker  . Smokeless tobacco: Never Used  Substance Use Topics  . Alcohol use: No  . Drug use: No     Allergies   Patient has no known allergies.   Review of Systems Review of Systems Ten systems reviewed and are negative for acute change, except as noted in the HPI.    Physical Exam Updated Vital Signs BP 123/85   Pulse 68   Temp 98.2 F (36.8 C) (Oral)   Resp 18   Ht 5\' 6"  (1.676  m)   Wt 90.3 kg   LMP 07/19/2018   SpO2 100%   BMI 32.12 kg/m   Physical Exam Vitals signs and nursing note reviewed.  Constitutional:      General: She is not in acute distress.    Appearance: She is well-developed. She is not diaphoretic.  HENT:     Head: Normocephalic and atraumatic.  Eyes:     General: No scleral icterus.    Conjunctiva/sclera: Conjunctivae normal.  Neck:     Musculoskeletal: Normal range of motion.  Cardiovascular:     Rate and Rhythm: Normal rate and regular rhythm.     Heart sounds: Normal heart sounds. No murmur. No friction rub. No gallop.   Pulmonary:     Effort: Pulmonary effort is normal. No respiratory distress.     Breath sounds: Normal breath sounds.  Abdominal:     General: Bowel sounds are normal. There is no distension.     Palpations: Abdomen is soft. There is no mass.     Tenderness: There is no abdominal tenderness.  There is no right CVA tenderness, left CVA tenderness or guarding.  Skin:    General: Skin is warm and dry.  Neurological:     Mental Status: She is alert and oriented to person, place, and time.  Psychiatric:        Behavior: Behavior normal.      ED Treatments / Results  Labs (all labs ordered are listed, but only abnormal results are displayed) Labs Reviewed  WET PREP, GENITAL - Abnormal; Notable for the following components:      Result Value   WBC, Wet Prep HPF POC MANY (*)    All other components within normal limits  URINALYSIS, ROUTINE W REFLEX MICROSCOPIC - Abnormal; Notable for the following components:   Color, Urine COLORLESS (*)    Specific Gravity, Urine 1.001 (*)    Hgb urine dipstick MODERATE (*)    Leukocytes,Ua TRACE (*)    All other components within normal limits  CBC WITH DIFFERENTIAL/PLATELET - Abnormal; Notable for the following components:   Hemoglobin 10.0 (*)    HCT 33.5 (*)    MCH 25.0 (*)    MCHC 29.9 (*)    All other components within normal limits  RPR  HIV ANTIBODY (ROUTINE TESTING W REFLEX)  BASIC METABOLIC PANEL  I-STAT BETA HCG BLOOD, ED ([facility], [facility], [facility] ONLY)  GC/CHLAMYDIA PROBE AMP () NOT AT [facility]    EKG None  Radiology No results found.  Procedures Procedures (including critical care time)  Medications Ordered in ED Medications - No data to display   Initial Impression / Assessment and Plan / ED Course  I have reviewed the triage vital signs and the nursing notes.  Pertinent labs & imaging results that were available during my care of the patient were reviewed by me and considered in my medical decision making (see chart for details).        Patient here with complaint of pelvic pain.  She has a benign abdominal and pelvic examination.  I personally reviewed the patient's lab work which shows negative pregnancy test, white blood cell count present on the wet prep without evidence of infection.,  Patient's hemoglobin  is low but not significantly.  Her urine shows no evidence of infection.  Think her urine is dilute and she is urinating frequently just because she is consuming a lot of water.  I doubt any other causes like diabetes insipidus or diabetes.  Her  blood sugar is within normal limits.  Patient has a large endocervical polyp visible at the cervical office.  This may be contributing to her increased bleeding.  She has no cervical motion tenderness, no adnexal fullness, no pain during the pelvic examination I doubt any emergent cause of her symptoms.  The patient does have follow-up at the health department in August however I [facility] she may follow-up as a walk-in or with an appointment.  I doubt any other emergent cause of her pelvic pain.  She does not have unilateral flank pain.  No evidence of torsion.  Patient is very comfortable during her examination.  She appears appropriate for discharge with outpatient follow-up at this time.  Final Clinical Impressions(s) / ED Diagnoses   Final diagnoses:  Pelvic pain in female  Polyp at cervical os    ED Discharge Orders    None       Arthor CaptainHarris, Aradhya Shellenbarger, PA-C 07/28/18 1610    Linwood DibblesKnapp, Jon, MD 07/31/18 [phone]

## 2018-07-28 NOTE — ED Triage Notes (Signed)
Patient reports that she is having lower abdominal pain and vaginal bleeding and clear vaginal discharge x 1 week.. Patient states [facility] and has an appointment for 08/20/18, but pain was persisting. Patient states she had her IUD checked and it is in place.

## 2018-07-29 LAB — GC/CHLAMYDIA PROBE AMP (~~LOC~~) NOT AT ARMC
Chlamydia: NEGATIVE
Neisseria Gonorrhea: NEGATIVE

## 2018-08-08 IMAGING — CR DG CHEST 2V
2 series · 2 of 2 positions shown · non-contrast
Comparison: 06/17/2010

CLINICAL DATA: Chest/left shoulder pain, cough

EXAM:
CHEST - 2 VIEW

[w chest pa]
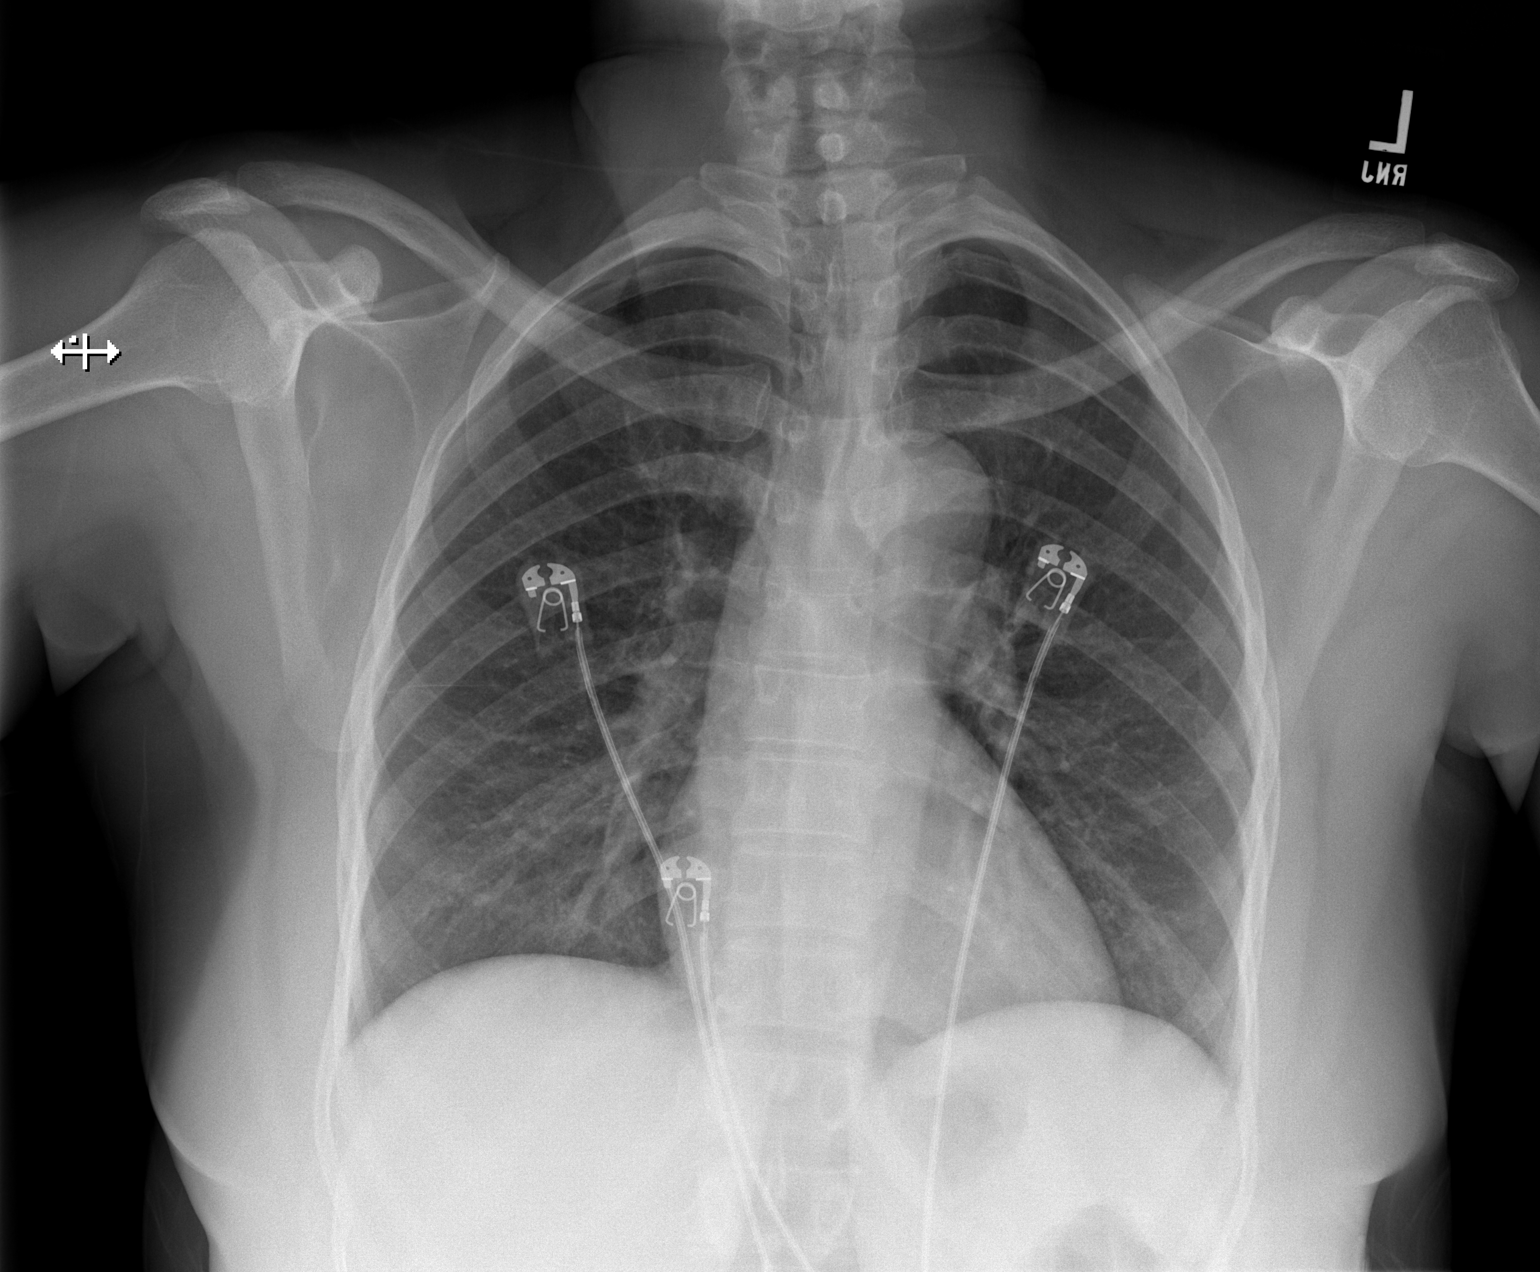

[w chest lat]
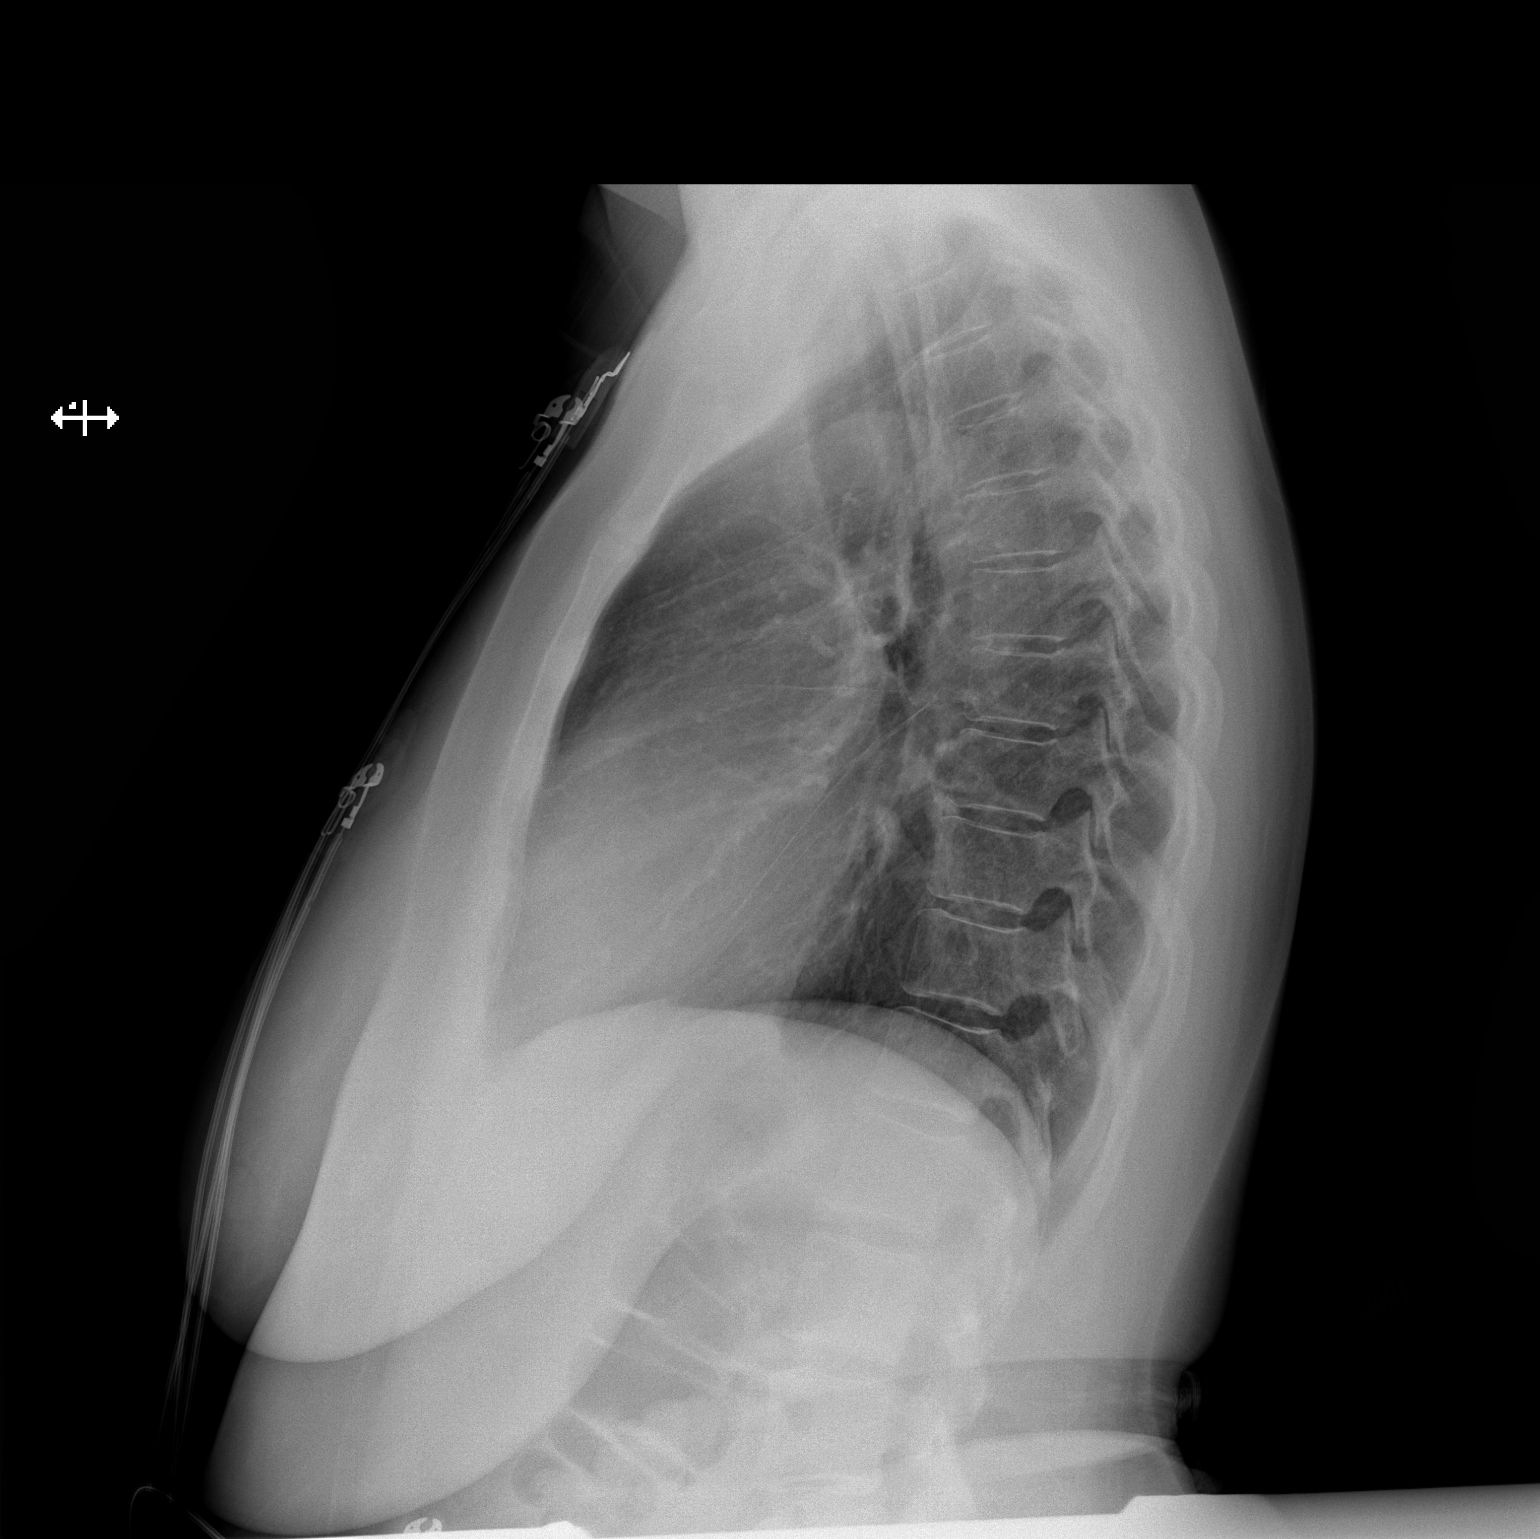

[2 of 2 positions shown; findings below may reference images not displayed]

FINDINGS: Lungs are clear.  No pleural effusion or pneumothorax.

The heart is normal in size.

Visualized osseous structures are within normal limits.
IMPRESSION: Normal chest radiographs.

## 2020-04-24 ENCOUNTER — Encounter: Payer: Self-pay | Admitting: Obstetrics and Gynecology

## 2020-04-24 ENCOUNTER — Other Ambulatory Visit: Payer: Self-pay

## 2020-04-24 ENCOUNTER — Ambulatory Visit (INDEPENDENT_AMBULATORY_CARE_PROVIDER_SITE_OTHER): Payer: No Typology Code available for payment source | Admitting: Obstetrics and Gynecology

## 2020-04-24 VITALS — BP 123/80 | HR 74 | Wt 184.0 lb

## 2020-04-24 DIAGNOSIS — Z1239 Encounter for other screening for malignant neoplasm of breast: Secondary | ICD-10-CM | POA: Insufficient documentation

## 2020-04-24 DIAGNOSIS — N841 Polyp of cervix uteri: Secondary | ICD-10-CM | POA: Insufficient documentation

## 2020-04-24 DIAGNOSIS — N930 Postcoital and contact bleeding: Secondary | ICD-10-CM | POA: Insufficient documentation

## 2020-04-24 DIAGNOSIS — Z124 Encounter for screening for malignant neoplasm of cervix: Secondary | ICD-10-CM | POA: Insufficient documentation

## 2020-04-24 DIAGNOSIS — Z789 Other specified health status: Secondary | ICD-10-CM | POA: Insufficient documentation

## 2020-04-24 DIAGNOSIS — Z975 Presence of (intrauterine) contraceptive device: Secondary | ICD-10-CM | POA: Insufficient documentation

## 2020-04-24 HISTORY — DX: Polyp of cervix uteri: N84.1

## 2020-04-24 HISTORY — DX: Postcoital and contact bleeding: N93.0

## 2020-04-24 NOTE — Progress Notes (Signed)
Obstetrics and Gynecology New Patient Evaluation  Appointment Date: 04/24/2020  OB[facility] Clinic[facility]   Referring Provider: [facility]  Chief Complaint: cervical polyp  History of Present Illness: Megan [name] is a 50 y.o. [phone] (Patient's last menstrual period was 04/16/2020 (exact date).), seen for the above chief complaint.   Patient had annual visit with [facility] on 1/5 and large cervical polyp noted. Pap smear then was negative and she was referred to here for removal.    Patient endorses regularly monthly periods that are somewhat heavy. +post coital bleeding.   Review of Systems: Pertinent items noted in HPI and remainder of comprehensive ROS otherwise negative.    Past Medical History:  Past Medical History:  Diagnosis Date  . Allergy   . Pneumonia 2012    Past Surgical History:  Past Surgical History:  Procedure Laterality Date  . CESAREAN SECTION      Past Obstetrical History:  OB History  Gravida Para Term Preterm AB Living  6 4 4   1 4   SAB IAB Ectopic Multiple Live Births  1            # Outcome Date GA Lbr Len/2nd Weight Sex Delivery Anes PTL Lv  6 Gravida              Birth Comments: System Generated. Please review and update pregnancy details.  5 SAB           4 Term           3 Term           2 Term           1 Term              Past Gynecological History: As per HPI. Paragard IUD has been in place for past 7 years per patient.   Social History:  Social History   Socioeconomic History  . Marital status: Married    Spouse name: Not on file  . Number of children: Not on file  . Years of education: Not on file  . Highest education level: Not on file  Occupational History  . Not on file  Tobacco Use  . Smoking status: Never Smoker  . Smokeless tobacco: Never Used  Vaping Use  . Vaping Use: Never used  Substance and Sexual Activity  . Alcohol use: No  . Drug use: No  . Sexual  activity: Yes    Birth control/protection: None  Other Topics Concern  . Not on file  Social History Narrative  . Not on file   Social Determinants of Health   Financial Resource Strain: Not on file  Food Insecurity: Not on file  Transportation Needs: Not on file  Physical Activity: Not on file  Stress: Not on file  Social Connections: Not on file  Intimate Partner Violence: Not on file   Medications Daysie Helf had no medications administered during this visit. Current Outpatient Medications  Medication Sig Dispense Refill  . acetaminophen (TYLENOL) 500 MG tablet Take 500 mg by mouth every 6 (six) hours as needed for mild pain.    Shelton Silvas albuterol (VENTOLIN HFA) 108 (90 Base) MCG/ACT inhaler Inhale 2 puffs into the lungs every 6 (six) hours as needed for wheezing or shortness of breath.    . Multiple Vitamin (MULTIVITAMIN WITH MINERALS) TABS tablet Take 1 tablet by mouth daily.    Marland Kitchen PARAGARD INTRAUTERINE COPPER IU 1 Device by Intrauterine route once.    Marland Kitchen  cyclobenzaprine (FLEXERIL) 10 MG tablet Take 1 tablet (10 mg total) by mouth 2 (two) times daily as needed for muscle spasms. (Patient not taking: No sig reported) 20 tablet 0  . dextromethorphan (DELSYM) 30 MG/5ML liquid Take 10 mLs (60 mg total) by mouth as needed for cough. (Patient not taking: No sig reported) 89 mL 0   No current facility-administered medications for this visit.    Allergies Patient has no known allergies.   Physical Exam:  BP 123/80   Pulse 74   Wt 184 lb (83.5 kg)   LMP 04/16/2020 (Exact Date)   BMI 29.70 kg/m  Body mass index is 29.7 kg/m. General appearance: Well nourished, well developed female in no acute distress.  Cardiovascular: normal s1 and s2.  No murmurs, rubs or gallops. Respiratory:  Clear to auscultation bilateral. Normal respiratory effort Abdomen: positive bowel sounds and no masses, hernias; diffusely non tender to palpation, non distended Neuro/Psych:  Normal mood and affect.   Skin:  Warm and dry.  Lymphatic:  No inguinal lymphadenopathy.   Pelvic exam: is not limited by body habitus EGBUS: within normal limits Vagina: within normal limits and with no blood or discharge in the vault Cervix: benign appearing cervical polyp 3.5 x 3.5 pedunculated, base at 4 o'clock just inside the external os. Somewhat friable IUD strings white, 4cm  Uterus:  nonenlarged and non tender Adnexa:  normal adnexa and no mass, fullness, tenderness Rectovaginal: deferred  Laboratory: as per hpi  Radiology: none  Assessment: pt stable  Plan: 1. Language barrier Interpreter used  2. Cervical polyp D/w her re: removal via LEEP procedure, which she is amenable to. Will bring back for procedure  3. PCB (post coital bleeding) Likely due to polyp  4. IUD (intrauterine device) in place I told her that this has not caused the polyp to form; she would like to keep in place  5. GYN Care 01/12/20. Mammogram scholarship given by [facility].   RTC LEEP procedure  Cornelia Copa MD Attending Center for [facility])

## 2020-05-18 ENCOUNTER — Ambulatory Visit: Payer: No Typology Code available for payment source | Admitting: Obstetrics and Gynecology

## 2020-06-08 ENCOUNTER — Encounter: Payer: Self-pay | Admitting: Obstetrics and Gynecology

## 2020-06-08 ENCOUNTER — Other Ambulatory Visit: Payer: Self-pay

## 2020-06-08 ENCOUNTER — Other Ambulatory Visit (HOSPITAL_COMMUNITY)
Admission: RE | Admit: 2020-06-08 | Discharge: 2020-06-08 | Disposition: A | Discharge status: Home / Self Care | Payer: No Typology Code available for payment source | Source: Ambulatory Visit | Attending: Obstetrics and Gynecology | Admitting: Obstetrics and Gynecology

## 2020-06-08 ENCOUNTER — Ambulatory Visit (INDEPENDENT_AMBULATORY_CARE_PROVIDER_SITE_OTHER): Payer: No Typology Code available for payment source | Admitting: Obstetrics and Gynecology

## 2020-06-08 VITALS — BP 123/64 | HR 81 | Wt 184.9 lb

## 2020-06-08 DIAGNOSIS — N841 Polyp of cervix uteri: Secondary | ICD-10-CM | POA: Insufficient documentation

## 2020-06-08 DIAGNOSIS — Z3202 Encounter for pregnancy test, result negative: Secondary | ICD-10-CM

## 2020-06-08 DIAGNOSIS — Z789 Other specified health status: Secondary | ICD-10-CM

## 2020-06-08 HISTORY — PX: LEEP: PRO1013

## 2020-06-08 LAB — POCT PREGNANCY, URINE: Preg Test, Ur: NEGATIVE

## 2020-06-08 NOTE — Procedures (Signed)
Loop Electrosurgical Excisional Procedure Note  Pre-operative Diagnosis: Post coital bleeding. Pedunculated cervical polyp. IUD in place  Post-operative Diagnosis: Same  Procedure Details  Urine pregnancy test: negative   The risks (including infection, bleeding, pain, preterm birth, cervical stenosis) and benefits of the procedure were explained to the patient and written informed consent was obtained.  The patient was placed in the dorsal lithotomy position. A coated Graves was speculum inserted in the vagina, and the cervix was visualized, with the below noted findings  The cervical stromal bed at based of the polyp and the polyp itself were injected with 23mL of 1% lidocaine with epinephrine. Entering at 4 o'clock on the cervix and using a small top hat loop and a setting of 50/50 blend current, a LEEP was done, in one motion with no bleeding noted; the specimen was passed off.  Next, the ball electrode on 50 coagulation current was used at the excision site and an application of Monsel's done.   Findings: benign appearing cervical polyp 3.5 x 3.5 pedunculated, base at 4 o'clock just inside the external os. Somewhat friable IUD strings white, 4cm  Specimens: endocervical polyp (benign appearing)  Condition: Stable  Complications: None  Plan: The patient was advised to call for any fever or for prolonged or severe pain or bleeding. She was advised to use OTC analgesics as needed for mild to moderate pain. She was advised to do pelvic rest x 1 month. Pelvic rest was advised until after her four week post operative visit.   Interpreter used   Jim [place]pe Bing, Montez Hageman MD Attending Center for [facility])

## 2020-06-12 LAB — SURGICAL PATHOLOGY

## 2020-06-13 ENCOUNTER — Telehealth: Payer: Self-pay

## 2020-06-13 NOTE — Telephone Encounter (Addendum)
-----   Message from [place] Bing, MD sent at 06/13/2020 10:30 AM EDT ----- Can y'all let her know that the polyp I removed was benign, no cancer? thanks  Called pt with [employer] # [phone] and left message her results from her visit on 06/08/20 are negative if she has any questions to please give the office a call.    Leonette Nutting  06/13/20

## 2020-07-17 ENCOUNTER — Other Ambulatory Visit: Payer: Self-pay

## 2020-07-17 ENCOUNTER — Encounter: Payer: Self-pay | Admitting: Obstetrics and Gynecology

## 2020-07-17 ENCOUNTER — Ambulatory Visit (INDEPENDENT_AMBULATORY_CARE_PROVIDER_SITE_OTHER): Payer: No Typology Code available for payment source | Admitting: Obstetrics and Gynecology

## 2020-07-17 VITALS — BP 146/70 | HR 77 | Wt 184.3 lb

## 2020-07-17 DIAGNOSIS — I10 Essential (primary) hypertension: Secondary | ICD-10-CM | POA: Insufficient documentation

## 2020-07-17 DIAGNOSIS — N841 Polyp of cervix uteri: Secondary | ICD-10-CM

## 2020-07-17 DIAGNOSIS — N920 Excessive and frequent menstruation with regular cycle: Secondary | ICD-10-CM | POA: Insufficient documentation

## 2020-07-17 DIAGNOSIS — R350 Frequency of micturition: Secondary | ICD-10-CM | POA: Insufficient documentation

## 2020-07-17 DIAGNOSIS — Z789 Other specified health status: Secondary | ICD-10-CM

## 2020-07-17 NOTE — Progress Notes (Signed)
Obstetrics and Gynecology Visit Return Patient Evaluation  Appointment Date: 07/17/2020  OB[facility]N Clinic: [facility]  Chief Complaint: f/u in office cervical polypectomy  History of Present Illness:  Megan [name] is a 50 y.o. here for routine follow up s/p 6/2 removal. Pathology benign  Interval History: MP 7/1. Since that time, she states that she has no post coital or intermenstrual bleeding but she did have another heavy period and was disappointed that her period wasn't better after polyp removal.   Review of Systems: as noted in the History of Present Illness.   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 Current Outpatient Medications  Medication Sig Dispense Refill   acetaminophen (TYLENOL) 500 MG tablet Take 500 mg by mouth every 6 (six) hours as needed for mild pain.     albuterol (VENTOLIN HFA) 108 (90 Base) MCG/ACT inhaler Inhale 2 puffs into the lungs every 6 (six) hours as needed for wheezing or shortness of breath.     Multiple Vitamin (MULTIVITAMIN WITH MINERALS) TABS tablet Take 1 tablet by mouth daily.     cyclobenzaprine (FLEXERIL) 10 MG tablet Take 1 tablet (10 mg total) by mouth 2 (two) times daily as needed for muscle spasms. (Patient not taking: No sig reported) 20 tablet 0   dextromethorphan (DELSYM) 30 MG/5ML liquid Take 10 mLs (60 mg total) by mouth as needed for cough. (Patient not taking: No sig reported) 89 mL 0   PARAGARD INTRAUTERINE COPPER IU 1 Device by Intrauterine route once. (Patient not taking:  Reported on 07/17/2020)     No current facility-administered medications for this visit.    Allergies: has No Known Allergies.  Physical Exam:  BP (!) 146/70   Pulse 77   Wt 184 lb 4.8 oz (83.6 kg)   LMP 07/07/2020   BMI 29.75 kg/m  Body mass index is 29.75 kg/m. General appearance: Well nourished, well developed female in no acute distress.  Neuro/Psych:  Normal mood and affect.    Pelvic exam:  EGBUS, vaginal vault and cervix: within normal limits. White IUD strings 4cm tucked into fornices. No bleeding. No e/o polyp or removal   Assessment: pt doing well  Plan:  1. Cervical polyp Patient fully healed. Pt okay to come off pelciv rest  2. Menorrhagia with regular cycle D/w her that this could be due to paragard. If patient would want to consider removing paragard or talking about other options, I told her to follow back up with the HD and same for her HTN; pt amenable to plan   3. Hypertension, unspecified type  Interpreter used   RTC: PRN  Cornelia Copa MD Attending Center for [facility])

## 2020-07-18 ENCOUNTER — Emergency Department (HOSPITAL_COMMUNITY)
Admission: EM | Admit: 2020-07-18 | Discharge: 2020-07-18 | Disposition: A | Discharge status: Home / Self Care | Payer: No Typology Code available for payment source | Attending: Emergency Medicine | Admitting: Emergency Medicine

## 2020-07-18 ENCOUNTER — Encounter (HOSPITAL_COMMUNITY): Payer: Self-pay

## 2020-07-18 DIAGNOSIS — R350 Frequency of micturition: Secondary | ICD-10-CM

## 2020-07-18 DIAGNOSIS — Z0130 Encounter for examination of blood pressure without abnormal findings: Secondary | ICD-10-CM

## 2020-07-18 LAB — URINALYSIS, ROUTINE W REFLEX MICROSCOPIC
Bilirubin Urine: NEGATIVE
Glucose, UA: NEGATIVE mg/dL
Ketones, ur: NEGATIVE mg/dL
Nitrite: NEGATIVE
Protein, ur: NEGATIVE mg/dL
Specific Gravity, Urine: 1.011 (ref 1.005–1.030)
pH: 5 (ref 5.0–8.0)

## 2020-07-18 NOTE — Discharge Instructions (Addendum)
Make an appointment with [facility] or with a primary care doctor of your choice for recheck of blood pressure and for follow up on your symptoms of urinary frequency.   Return to the ED with any new or concerning symptoms at any time.

## 2020-07-18 NOTE — ED Provider Notes (Signed)
[facility]-EMERGENCY DEPT Provider Note   CSN: [id number] Arrival date & time: 07/17/20  2212     History Chief Complaint  Patient presents with   Hypertension    Megan [name] is a 50 y.o. female.  Patient to ED with concern for elevated blood pressure found at the [facility] during a routine office visit yesterday. No history of hypertension. No chest pain. She denies SOB, cough, fever, nausea. She does report urinary frequency and burning. No other symptoms.   The history is provided by the patient. No language interpreter was used.  Hypertension Pertinent negatives include no chest pain, no headaches and no shortness of breath.      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 No pertinent family history.  Social History   Tobacco Use   Smoking status: Never   Smokeless tobacco: Never  Vaping Use   Vaping Use: Never used  Substance Use Topics   Alcohol use: No   Drug use: No    Home Medications Prior to Admission medications   Medication Sig Start Date End Date Taking? Authorizing Provider  acetaminophen (TYLENOL) 500 MG tablet Take 500 mg by mouth every 6 (six)  hours as needed for mild pain.    [provider]  albuterol (VENTOLIN HFA) 108 (90 Base) MCG/ACT inhaler Inhale 2 puffs into the lungs every 6 (six) hours as needed for wheezing or shortness of breath.    [provider]  cyclobenzaprine (FLEXERIL) 10 MG tablet Take 1 tablet (10 mg total) by mouth 2 (two) times daily as needed for muscle spasms. Patient not taking: No sig reported 06/11/17   Tegeler, Canary Brim, MD  dextromethorphan (DELSYM) 30 MG/5ML liquid Take 10 mLs (60 mg total) by mouth as needed for cough. Patient not taking: No sig reported 01/29/12   Cleora Fleet, MD  Multiple Vitamin (MULTIVITAMIN WITH MINERALS) TABS tablet Take 1 tablet by mouth daily.    [provider]  PARAGARD INTRAUTERINE COPPER IU 1 Device by Intrauterine route once. Patient not taking: Reported on 07/17/2020    [provider]    Allergies    Patient has no known allergies.  Review of Systems   Review of Systems  Constitutional:  Negative for chills and fever.  HENT: Negative.    Respiratory: Negative.  Negative for shortness of breath.  Cardiovascular: Negative.  Negative for chest pain.  Gastrointestinal: Negative.   Genitourinary:  Positive for dysuria and frequency. Negative for flank pain.  Musculoskeletal: Negative.   Skin: Negative.   Neurological: Negative.  Negative for weakness and headaches.   Physical Exam Updated Vital Signs BP 129/88 (BP Location: Left Arm)   Pulse 61   Temp 98.3 F (36.8 C) (Oral)   Resp 17   Ht 5\' 6"  (1.676 m)   Wt 83.6 kg   LMP 07/07/2020   SpO2 100%   BMI 29.75 kg/m   Physical Exam Vitals and nursing note reviewed.  Constitutional:      Appearance: She is well-developed.  HENT:     Head: Normocephalic.  Cardiovascular:     Rate and Rhythm: Normal rate and regular rhythm.     Heart sounds: No murmur heard. Pulmonary:     Effort: Pulmonary effort is normal.     Breath sounds: Normal breath sounds. No  wheezing, rhonchi or rales.  Abdominal:     General: Bowel sounds are normal.     Palpations: Abdomen is soft.     Tenderness: There is no abdominal tenderness. There is no guarding or rebound.  Musculoskeletal:        General: Normal range of motion.     Cervical back: Normal range of motion and neck supple.  Skin:    General: Skin is warm and dry.  Neurological:     General: No focal deficit present.     Mental Status: She is alert and oriented to person, place, and time.    ED Results / Procedures / Treatments   Labs (all labs ordered are listed, but only abnormal results are displayed) Labs Reviewed  URINALYSIS, ROUTINE W REFLEX MICROSCOPIC - Abnormal; Notable for the following components:      Result Value   Color, Urine STRAW (*)    Hgb urine dipstick MODERATE (*)    Leukocytes,Ua MODERATE (*)    Bacteria, UA RARE (*)    All other components within normal limits  URINE CULTURE    EKG None  Radiology No results found.  Procedures Procedures   Medications Ordered in ED Medications - No data to display  ED Course  I have reviewed the triage vital signs and the nursing notes.  Pertinent labs & imaging results that were available during my care of the patient were reviewed by me and considered in my medical decision making (see chart for details).    MDM Rules/Calculators/A&P                          Patient to ED with concern for high blood pressure. Also report urinary symptoms.   BP normal in the ED on multiple readings. UA negative for infection - showing 6-10 WBCs but rare bacteria, nitrite negative. Culture pending.   Recommended follow up with PCP, will make referral as she is not established with PCP. Stable for discharge.   Final Clinical Impression(s) / ED Diagnoses Final diagnoses:  None   Urinary frequency Blood pressure concern  Rx / DC Orders ED Discharge Orders     None        09/07/2020, PA-C 07/18/20 0541    Tegeler,  09/18/20, MD 07/18/20 [phone]

## 2020-07-18 NOTE — ED Triage Notes (Signed)
Pt states that she has been having high blood pressure today and has been feeling dizzy and tired.

## 2020-07-18 NOTE — ED Notes (Signed)
Urine collected and sent to lab.

## 2020-07-19 ENCOUNTER — Ambulatory Visit: Payer: Self-pay

## 2020-07-19 LAB — URINE CULTURE

## 2020-07-19 NOTE — Telephone Encounter (Signed)
Reason for Disposition  [1] Chest pain lasts > 5 minutes AND [2] occurred > 3 days ago (72 hours) AND [3] NO chest pain or cardiac symptoms now  Answer Assessment - Initial Assessment Questions 1. LOCATION: "Where does it hurt?"       Left side 2. RADIATION: "Does the pain go anywhere else?" (e.g., into neck, jaw, arms, back)     Cramp in right arm 3. ONSET: "When did the chest pain begin?" (Minutes, hours or days)      Monday 4. PATTERN "Does the pain come and go, or has it been constant since it started?"  "Does it get worse with exertion?"      Comes and goes - worse at night 5. DURATION: "How long does it last" (e.g., seconds, minutes, hours)     Minutes 6. SEVERITY: "How bad is the pain?"  (e.g., Scale 1-10; mild, moderate, or severe)    - MILD (1-3): doesn't interfere with normal activities     - MODERATE (4-7): interferes with normal activities or awakens from sleep    - SEVERE (8-10): excruciating pain, unable to do any normal activities       Cramp - mild 7. CARDIAC RISK FACTORS: "Do you have any history of heart problems or risk factors for heart disease?" (e.g., angina, prior heart attack; diabetes, high blood pressure, high cholesterol, smoker, or strong family history of heart disease)     HTN 8. PULMONARY RISK FACTORS: "Do you have any history of lung disease?"  (e.g., blood clots in lung, asthma, emphysema, birth control pills)     No 9. CAUSE: "What do you think is causing the chest pain?"     Unsure 10. OTHER SYMPTOMS: "Do you have any other symptoms?" (e.g., dizziness, nausea, vomiting, sweating, fever, difficulty breathing, cough)       Anxiety 11. PREGNANCY: "Is there any chance you are pregnant?" "When was your last menstrual period?"       No  Protocols used: Chest Pain-A-AH

## 2020-07-19 NOTE — Telephone Encounter (Signed)
Pt. Reports "sometimes I have cramps on the left side of my chest, mostly at night." Lasts a few minutes and then goes away. No pain today. "It may be nerves." Pt. Given phone number for Primary Care at [facility] to establish with PCP. Instructed pt. To go to ED for worsening

## 2020-10-07 NOTE — Progress Notes (Signed)
Subjective:    Megan [name] - 50 y.o. female MRN [id number]  Date of birth: [birth year]  HPI  Megan [name] is to establish care.   Current issues and/or concerns: Reports history of cervical polyps and removed several years ago. No current issues or concerns regarding the same.    ROS per HPI   Health Maintenance:  Health Maintenance Due  Topic Date Due   COVID-19 Vaccine (1) Never done   TETANUS/TDAP  Never done   COLONOSCOPY (Pts 45-50yrs Insurance coverage will need to be confirmed)  Never done   INFLUENZA VACCINE  Never done    Past Medical History: Patient Active Problem List   Diagnosis Date Noted   Menorrhagia 07/17/2020   Hypertension 07/17/2020   Breast cancer screening 04/24/2020   Cervical cancer screening 04/24/2020   Language barrier 04/24/2020   IUD (intrauterine device) in place 04/24/2020   IRREGULAR MENSTRUATION 07/29/2006   BACK PAIN 07/29/2006   PELVIC  PAIN 07/29/2006   FIBROADENOSIS, BREAST 03/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 She has never used smokeless tobacco. She reports that she does not drink alcohol and does not use drugs.   Family History  family history is not on file.   Medications: reviewed and updated   Objective:   Physical Exam BP 112/77 (BP Location: Left Arm, Patient Position: Sitting, Cuff Size: Large)   Pulse 71   Temp 98.6 F (37 C)   Resp 16   Ht 5\' 5"  (1.651 m)   Wt 181 lb (82.1 kg)   LMP 09/08/2020 (Exact Date)   SpO2 98%   BMI 30.12 kg/m   Physical Exam HENT:     Head: Normocephalic and atraumatic.  Eyes:     Extraocular Movements: Extraocular movements intact.     Conjunctiva/sclera: Conjunctivae normal.     Pupils: Pupils are equal, round, and reactive to light.  Cardiovascular:     Rate and Rhythm: Normal rate and regular rhythm.     Pulses: Normal pulses.     Heart sounds: Normal heart sounds.  Pulmonary:     Effort: Pulmonary effort is normal.     Breath  sounds: Normal breath sounds.  Musculoskeletal:     Cervical back: Normal range of motion and neck supple.  Neurological:     General: No focal deficit present.     Mental Status: She is alert and oriented to person, place, and time.  Psychiatric:        Mood and Affect: Mood normal.        Behavior: Behavior normal.       Assessment & Plan:  1. Encounter to establish care: - Patient presents today to establish care.  - Return for annual physical examination, labs, and health maintenance. Arrive fasting meaning having no food for at least 8 hours prior to appointment. You may have only water or black coffee. Please take scheduled medications as normal.  2. Language barrier: - [employer] participated during today's visit. Interpreter Name: 11/08/2020, ID#Lindie Spruce.   Patient was given clear instructions to go to Emergency Department or return to medical center if symptoms don't improve, worsen, or new problems develop.The patient verbalized understanding.  I discussed the assessment and treatment plan with the patient. The patient was provided an opportunity to ask questions and all were answered. The patient agreed with the plan and demonstrated an understanding of the instructions.   The patient was advised to call back or seek  an in-person evaluation if the symptoms worsen or if the condition fails to improve as anticipated.    Ricky Stabs, NP 10/11/2020, 11:01 AM Primary Care at [facility]

## 2020-10-11 ENCOUNTER — Other Ambulatory Visit: Payer: Self-pay

## 2020-10-11 ENCOUNTER — Ambulatory Visit (INDEPENDENT_AMBULATORY_CARE_PROVIDER_SITE_OTHER): Payer: Self-pay | Admitting: Family

## 2020-10-11 ENCOUNTER — Encounter: Payer: Self-pay | Admitting: Family

## 2020-10-11 VITALS — BP 112/77 | HR 71 | Temp 98.6°F | Resp 16 | Ht 65.0 in | Wt 181.0 lb

## 2020-10-11 DIAGNOSIS — Z789 Other specified health status: Secondary | ICD-10-CM

## 2020-10-11 DIAGNOSIS — Z7689 Persons encountering health services in other specified circumstances: Secondary | ICD-10-CM

## 2020-10-11 NOTE — Patient Instructions (Signed)
Thank you for choosing [facility] at [facility] for your medical home!    Megan [name] was seen by Megan Fendt, NP today.   Megan [name] primary care provider is Megan Stabs, NP.   For the best care possible,  you should try to see Megan Stabs, NP whenever you come to clinic.   We look forward to seeing you again soon!  If you have any questions about your visit today,  please call us at [phone]  Or feel free to reach your provider via MyChart.    Keeping you healthy   Get these tests Blood pressure- Have your blood pressure checked once a year by your healthcare provider.  Normal blood pressure is 120/80. Weight- Have your body mass index (BMI) calculated to screen for obesity.  BMI is a measure of body fat based on height and weight. You can also calculate your own BMI at https://www.west-esparza.com/. Cholesterol- Have your cholesterol checked regularly starting at age 33, sooner may be necessary if you have diabetes, high blood pressure, if a family member developed heart diseases at an early age or if you smoke.  Chlamydia, HIV, and other sexual transmitted disease- Get screened each year until the age of 76 then within three months of each new sexual partner. Diabetes- Have your blood sugar checked regularly if you have high blood pressure, high cholesterol, a family history of diabetes or if you are overweight.   Get these vaccines Flu shot- Every fall. Tetanus shot- Every 10 years. Menactra- Single dose; prevents meningitis.   Take these steps Don't smoke- If you do smoke, ask your healthcare provider about quitting. For tips on how to quit, go to www.smokefree.gov or call 1-800-QUIT-NOW. Be physically active- Exercise 5 days a week for at least 30 minutes.  If you are not already physically active start slow and gradually work up to 30 minutes of moderate physical activity.  Examples of moderate activity include walking briskly, mowing the yard, dancing,  swimming bicycling, etc. Eat a healthy diet- Eat a variety of healthy foods such as fruits, vegetables, low fat milk, low fat cheese, yogurt, lean meats, poultry, fish, beans, tofu, etc.  For more information on healthy eating, go to www.thenutritionsource.org Drink alcohol in moderation- Limit alcohol intake two drinks or less a day.  Never drink and drive. Dentist- Brush and floss teeth twice daily; visit your dentis twice a year. Depression-Your emotional health is as important as your physical health.  If you're feeling down, losing interest in things you normally enjoy please talk with your healthcare provider. Gun Safety- If you keep a gun in your home, keep it unloaded and with the safety lock on.  Bullets should be stored separately. Helmet use- Always wear a helmet when riding a motorcycle, bicycle, rollerblading or skateboarding. Safe sex- If you may be exposed to a sexually transmitted infection, use a condom Seat belts- Seat bels can save your life; always wear one. Smoke/Carbon Monoxide detectors- These detectors need to be installed on the appropriate level of your home.  Replace batteries at least once a year. Skin Cancer- When out in the sun, cover up and use sunscreen SPF 15 or higher. Violence- If anyone is threatening or hurting you, please tell your healthcare provider.

## 2020-11-19 NOTE — Progress Notes (Signed)
Erroneous encounter

## 2020-11-23 ENCOUNTER — Encounter: Payer: No Typology Code available for payment source | Admitting: Family

## 2020-11-23 DIAGNOSIS — Z0000 Encounter for general adult medical examination without abnormal findings: Secondary | ICD-10-CM

## 2020-11-23 DIAGNOSIS — Z131 Encounter for screening for diabetes mellitus: Secondary | ICD-10-CM

## 2020-11-23 DIAGNOSIS — Z130 Encounter for screening for diseases of the blood and blood-forming organs and certain disorders involving the immune mechanism: Secondary | ICD-10-CM

## 2020-11-23 DIAGNOSIS — Z1231 Encounter for screening mammogram for malignant neoplasm of breast: Secondary | ICD-10-CM

## 2020-11-23 DIAGNOSIS — Z113 Encounter for screening for infections with a predominantly sexual mode of transmission: Secondary | ICD-10-CM

## 2020-11-23 DIAGNOSIS — Z13228 Encounter for screening for other metabolic disorders: Secondary | ICD-10-CM

## 2020-11-23 DIAGNOSIS — Z1329 Encounter for screening for other suspected endocrine disorder: Secondary | ICD-10-CM

## 2020-11-23 DIAGNOSIS — Z13220 Encounter for screening for lipoid disorders: Secondary | ICD-10-CM

## 2020-11-23 DIAGNOSIS — Z789 Other specified health status: Secondary | ICD-10-CM

## 2020-11-23 DIAGNOSIS — Z1211 Encounter for screening for malignant neoplasm of colon: Secondary | ICD-10-CM

## 2020-11-23 DIAGNOSIS — Z124 Encounter for screening for malignant neoplasm of cervix: Secondary | ICD-10-CM

## 2021-04-09 ENCOUNTER — Telehealth: Payer: Self-pay | Admitting: Family

## 2021-04-09 NOTE — Telephone Encounter (Signed)
Pt is calling to renew her financial Assistance Please advise CB- [phone] ?

## 2021-09-20 ENCOUNTER — Encounter: Payer: Self-pay | Admitting: Emergency Medicine

## 2021-09-20 ENCOUNTER — Emergency Department (HOSPITAL_COMMUNITY)
Admission: EM | Admit: 2021-09-20 | Discharge: 2021-09-21 | Disposition: A | Discharge status: Home / Self Care | Payer: No Typology Code available for payment source | Attending: Emergency Medicine | Admitting: Emergency Medicine

## 2021-09-20 ENCOUNTER — Other Ambulatory Visit: Payer: Self-pay

## 2021-09-20 ENCOUNTER — Encounter (HOSPITAL_COMMUNITY): Payer: Self-pay | Admitting: Emergency Medicine

## 2021-09-20 ENCOUNTER — Ambulatory Visit
Admission: EM | Admit: 2021-09-20 | Discharge: 2021-09-20 | Disposition: A | Discharge status: Other Acute Inpatient Hospital | Payer: No Typology Code available for payment source

## 2021-09-20 ENCOUNTER — Emergency Department (HOSPITAL_COMMUNITY): Payer: No Typology Code available for payment source

## 2021-09-20 DIAGNOSIS — R109 Unspecified abdominal pain: Secondary | ICD-10-CM | POA: Diagnosis not present

## 2021-09-20 DIAGNOSIS — Y92410 Unspecified street and highway as the place of occurrence of the external cause: Secondary | ICD-10-CM | POA: Diagnosis not present

## 2021-09-20 DIAGNOSIS — R079 Chest pain, unspecified: Secondary | ICD-10-CM | POA: Insufficient documentation

## 2021-09-20 DIAGNOSIS — S0990XA Unspecified injury of head, initial encounter: Secondary | ICD-10-CM

## 2021-09-20 DIAGNOSIS — T07XXXA Unspecified multiple injuries, initial encounter: Secondary | ICD-10-CM

## 2021-09-20 DIAGNOSIS — R519 Headache, unspecified: Secondary | ICD-10-CM | POA: Diagnosis present

## 2021-09-20 DIAGNOSIS — R0789 Other chest pain: Secondary | ICD-10-CM

## 2021-09-20 DIAGNOSIS — R1032 Left lower quadrant pain: Secondary | ICD-10-CM

## 2021-09-20 LAB — CBC WITH DIFFERENTIAL/PLATELET
Abs Immature Granulocytes: 0.02 10*3/uL (ref 0.00–0.07)
Basophils Absolute: 0.1 10*3/uL (ref 0.0–0.1)
Basophils Relative: 1 %
Eosinophils Absolute: 0.1 10*3/uL (ref 0.0–0.5)
Eosinophils Relative: 0 %
HCT: 35.2 % — ABNORMAL LOW (ref 36.0–46.0)
Hemoglobin: 10.7 g/dL — ABNORMAL LOW (ref 12.0–15.0)
Immature Granulocytes: 0 %
Lymphocytes Relative: 15 %
Lymphs Abs: 1.7 10*3/uL (ref 0.7–4.0)
MCH: 24.8 pg — ABNORMAL LOW (ref 26.0–34.0)
MCHC: 30.4 g/dL (ref 30.0–36.0)
MCV: 81.7 fL (ref 80.0–100.0)
Monocytes Absolute: 0.5 10*3/uL (ref 0.1–1.0)
Monocytes Relative: 4 %
Neutro Abs: 8.9 10*3/uL — ABNORMAL HIGH (ref 1.7–7.7)
Neutrophils Relative %: 80 %
Platelets: 290 10*3/uL (ref 150–400)
RBC: 4.31 MIL/uL (ref 3.87–5.11)
RDW: 14 % (ref 11.5–15.5)
WBC: 11.2 10*3/uL — ABNORMAL HIGH (ref 4.0–10.5)
nRBC: 0 % (ref 0.0–0.2)

## 2021-09-20 LAB — I-STAT BETA HCG BLOOD, ED (MC, WL, AP ONLY): I-stat hCG, quantitative: <5 m[IU]/mL (ref <5)

## 2021-09-20 LAB — BASIC METABOLIC PANEL
Anion gap: 7 (ref 5–15)
BUN: 11 mg/dL (ref 6–20)
CO2: 25 mmol/L (ref 22–32)
Calcium: 9.2 mg/dL (ref 8.9–10.3)
Chloride: 108 mmol/L (ref 98–111)
Creatinine, Ser: 0.78 mg/dL (ref 0.44–1.00)
GFR, Estimated: >60 mL/min (ref >60)
Glucose, Bld: 149 mg/dL — ABNORMAL HIGH (ref 70–99)
Potassium: 3.8 mmol/L (ref 3.5–5.1)
Sodium: 140 mmol/L (ref 135–145)

## 2021-09-20 LAB — TROPONIN I (HIGH SENSITIVITY): Troponin I (High Sensitivity): 3 ng/L (ref <18)

## 2021-09-20 MED ORDER — ONDANSETRON 4 MG PO TBDP
8.0000 mg | ORAL_TABLET | Freq: Once | ORAL | Status: AC
  Administered 2021-09-20: 8 mg via ORAL
  Filled 2021-09-20: qty 2

## 2021-09-20 MED ORDER — IOHEXOL 350 MG/ML SOLN
75.0000 mL | Freq: Once | INTRAVENOUS | Status: AC | PRN
  Administered 2021-09-20: 75 mL via INTRAVENOUS

## 2021-09-20 MED ORDER — IBUPROFEN 800 MG PO TABS
800.0000 mg | ORAL_TABLET | Freq: Once | ORAL | Status: AC
  Administered 2021-09-21: 800 mg via ORAL
  Filled 2021-09-20: qty 1

## 2021-09-20 MED ORDER — OXYCODONE-ACETAMINOPHEN 5-325 MG PO TABS
1.0000 | ORAL_TABLET | Freq: Once | ORAL | Status: AC
  Administered 2021-09-20: 1 via ORAL
  Filled 2021-09-20: qty 1

## 2021-09-20 MED ORDER — NAPROXEN 500 MG PO TABS: 500.0000 mg | ORAL_TABLET | Freq: Two times a day (BID) | ORAL | 0 refills | Status: AC | PRN
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
## 2021-09-20 NOTE — ED Notes (Signed)
Patient is being discharged from the Urgent Care and sent to the Emergency Department via POV . Per HM, patient is in need of higher level of care due to eval. Patient is aware and verbalizes understanding of plan of care.  Vitals:   09/20/21 1239  BP: 120/61  Pulse: 76  Resp: 18  Temp: 98.2 F (36.8 C)  SpO2: 97%
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
## 2021-09-20 NOTE — ED Notes (Signed)
Patient ambulated to restroom with family with minimal assistance and returned to room.

## 2021-09-20 NOTE — Discharge Instructions (Signed)
Go to the emergency department as soon as you leave urgent care for further evaluation and management. 
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
